# Patient Record
Sex: Male | Born: 1945 | Race: White | Hispanic: No | Marital: Married | Smoking: Former smoker
Health system: Southern US, Community
[De-identification: ages and names within clinical notes are randomized; demographics above are authoritative.]

## PROBLEM LIST (undated history)

## (undated) DIAGNOSIS — C3490 Malignant neoplasm of unspecified part of unspecified bronchus or lung: Secondary | ICD-10-CM

## (undated) DIAGNOSIS — I10 Essential (primary) hypertension: Secondary | ICD-10-CM

## (undated) DIAGNOSIS — Z5189 Encounter for other specified aftercare: Secondary | ICD-10-CM

## (undated) DIAGNOSIS — H269 Unspecified cataract: Secondary | ICD-10-CM

## (undated) DIAGNOSIS — E119 Type 2 diabetes mellitus without complications: Secondary | ICD-10-CM

## (undated) DIAGNOSIS — M1990 Unspecified osteoarthritis, unspecified site: Secondary | ICD-10-CM

## (undated) HISTORY — DX: Encounter for other specified aftercare: Z51.89

## (undated) HISTORY — PX: MELANOMA EXCISION: SHX5266

## (undated) HISTORY — DX: Unspecified osteoarthritis, unspecified site: M19.90

## (undated) HISTORY — PX: OTHER SURGICAL HISTORY: SHX169

## (undated) HISTORY — DX: Unspecified cataract: H26.9

## (undated) HISTORY — DX: Malignant neoplasm of unspecified part of unspecified bronchus or lung: C34.90

## (undated) HISTORY — PX: CHOLECYSTECTOMY: SHX55

## (undated) HISTORY — PX: CATARACT EXTRACTION: SUR2

## (undated) HISTORY — DX: Essential (primary) hypertension: I10

## (undated) HISTORY — DX: Type 2 diabetes mellitus without complications: E11.9

---

## 2008-02-20 ENCOUNTER — Ambulatory Visit (HOSPITAL_COMMUNITY): Admission: RE | Admit: 2008-02-20 | Discharge: 2008-02-21 | Payer: Self-pay | Admitting: Neurosurgery

## 2008-06-13 ENCOUNTER — Encounter: Admission: RE | Admit: 2008-06-13 | Discharge: 2008-06-13 | Payer: Self-pay | Admitting: Neurosurgery

## 2008-07-02 ENCOUNTER — Ambulatory Visit (HOSPITAL_COMMUNITY): Admission: RE | Admit: 2008-07-02 | Discharge: 2008-07-02 | Payer: Self-pay | Admitting: Neurosurgery

## 2009-12-13 ENCOUNTER — Encounter: Admission: RE | Admit: 2009-12-13 | Discharge: 2009-12-13 | Payer: Self-pay | Admitting: Neurosurgery

## 2010-02-25 ENCOUNTER — Inpatient Hospital Stay (HOSPITAL_COMMUNITY): Admission: RE | Admit: 2010-02-25 | Discharge: 2010-02-27 | Payer: Self-pay | Admitting: Neurosurgery

## 2010-06-04 ENCOUNTER — Encounter: Admission: RE | Admit: 2010-06-04 | Discharge: 2010-06-04 | Payer: Self-pay | Admitting: Neurosurgery

## 2010-11-02 LAB — GLUCOSE, CAPILLARY
Glucose-Capillary: 119 mg/dL — ABNORMAL HIGH (ref 70–99)
Glucose-Capillary: 150 mg/dL — ABNORMAL HIGH (ref 70–99)
Glucose-Capillary: 187 mg/dL — ABNORMAL HIGH (ref 70–99)
Glucose-Capillary: 189 mg/dL — ABNORMAL HIGH (ref 70–99)
Glucose-Capillary: 191 mg/dL — ABNORMAL HIGH (ref 70–99)
Glucose-Capillary: 239 mg/dL — ABNORMAL HIGH (ref 70–99)
Glucose-Capillary: 242 mg/dL — ABNORMAL HIGH (ref 70–99)
Glucose-Capillary: 274 mg/dL — ABNORMAL HIGH (ref 70–99)
Glucose-Capillary: 288 mg/dL — ABNORMAL HIGH (ref 70–99)
Glucose-Capillary: 338 mg/dL — ABNORMAL HIGH (ref 70–99)
Glucose-Capillary: 356 mg/dL — ABNORMAL HIGH (ref 70–99)

## 2010-11-02 LAB — BASIC METABOLIC PANEL
BUN: 22 mg/dL (ref 6–23)
CO2: 27 mEq/L (ref 19–32)
Calcium: 10.8 mg/dL — ABNORMAL HIGH (ref 8.4–10.5)
Chloride: 98 mEq/L (ref 96–112)
Creatinine, Ser: 1.19 mg/dL (ref 0.4–1.5)
GFR calc Af Amer: >60 mL/min (ref >60)
GFR calc non Af Amer: >60 mL/min (ref >60)
Glucose, Bld: 199 mg/dL — ABNORMAL HIGH (ref 70–99)
Potassium: 4.4 mEq/L (ref 3.5–5.1)
Sodium: 135 mEq/L (ref 135–145)

## 2010-11-02 LAB — CBC
HCT: 47.6 % (ref 39.0–52.0)
Hemoglobin: 16.4 g/dL (ref 13.0–17.0)
MCH: 30.4 pg (ref 26.0–34.0)
MCHC: 34.4 g/dL (ref 30.0–36.0)
MCV: 88.3 fL (ref 78.0–100.0)
Platelets: 231 10*3/uL (ref 150–400)
RBC: 5.39 MIL/uL (ref 4.22–5.81)
RDW: 12.8 % (ref 11.5–15.5)
WBC: 13.3 10*3/uL — ABNORMAL HIGH (ref 4.0–10.5)

## 2010-11-02 LAB — DIFFERENTIAL
Basophils Absolute: 0.1 10*3/uL (ref 0.0–0.1)
Basophils Relative: 0 % (ref 0–1)
Eosinophils Absolute: 0.3 10*3/uL (ref 0.0–0.7)
Eosinophils Relative: 2 % (ref 0–5)
Lymphocytes Relative: 28 % (ref 12–46)
Lymphs Abs: 3.7 10*3/uL (ref 0.7–4.0)
Monocytes Absolute: 1 10*3/uL (ref 0.1–1.0)
Monocytes Relative: 7 % (ref 3–12)
Neutro Abs: 8.3 10*3/uL — ABNORMAL HIGH (ref 1.7–7.7)
Neutrophils Relative %: 63 % (ref 43–77)

## 2010-11-02 LAB — TYPE AND SCREEN
ABO/RH(D): O NEG
Antibody Screen: NEGATIVE

## 2010-11-02 LAB — SURGICAL PCR SCREEN
MRSA, PCR: NEGATIVE
Staphylococcus aureus: POSITIVE — AB

## 2010-11-02 LAB — POCT I-STAT 4, (NA,K, GLUC, HGB,HCT)
Glucose, Bld: 248 mg/dL — ABNORMAL HIGH (ref 70–99)
HCT: 44 % (ref 39.0–52.0)
Hemoglobin: 15 g/dL (ref 13.0–17.0)
Potassium: 4.6 mEq/L (ref 3.5–5.1)
Sodium: 139 mEq/L (ref 135–145)

## 2010-12-02 ENCOUNTER — Other Ambulatory Visit: Payer: Self-pay | Admitting: Ophthalmology

## 2010-12-02 ENCOUNTER — Observation Stay (HOSPITAL_COMMUNITY)
Admission: RE | Admit: 2010-12-02 | Discharge: 2010-12-03 | Disposition: A | Discharge status: Home / Self Care | Payer: BC Managed Care – HMO | Source: Ambulatory Visit | Attending: Ophthalmology | Admitting: Ophthalmology

## 2010-12-02 DIAGNOSIS — I10 Essential (primary) hypertension: Secondary | ICD-10-CM | POA: Insufficient documentation

## 2010-12-02 DIAGNOSIS — T8529XA Other mechanical complication of intraocular lens, initial encounter: Principal | ICD-10-CM | POA: Insufficient documentation

## 2010-12-02 DIAGNOSIS — H59029 Cataract (lens) fragments in eye following cataract surgery, unspecified eye: Secondary | ICD-10-CM | POA: Insufficient documentation

## 2010-12-02 DIAGNOSIS — Y831 Surgical operation with implant of artificial internal device as the cause of abnormal reaction of the patient, or of later complication, without mention of misadventure at the time of the procedure: Secondary | ICD-10-CM | POA: Insufficient documentation

## 2010-12-02 DIAGNOSIS — E119 Type 2 diabetes mellitus without complications: Secondary | ICD-10-CM | POA: Insufficient documentation

## 2010-12-02 LAB — GLUCOSE, CAPILLARY
Glucose-Capillary: 219 mg/dL — ABNORMAL HIGH (ref 70–99)
Glucose-Capillary: 190 mg/dL — ABNORMAL HIGH (ref 70–99)
Glucose-Capillary: 256 mg/dL — ABNORMAL HIGH (ref 70–99)
Glucose-Capillary: 321 mg/dL — ABNORMAL HIGH (ref 70–99)

## 2010-12-03 LAB — GLUCOSE, CAPILLARY: Glucose-Capillary: 255 mg/dL — ABNORMAL HIGH (ref 70–99)

## 2010-12-12 NOTE — Op Note (Signed)
NAME:  Walter Berg, Walter Berg               ACCOUNT NO.:  [id number]  MEDICAL RECORD NO.:  [id number]           PATIENT TYPE:  O  LOCATION:  5151                         FACILITY:  [facility]  PHYSICIAN:  Iveliz Garay D. Ashley Royalty, M.D. DATE OF BIRTH:  [birth year]  DATE OF PROCEDURE: DATE OF DISCHARGE:                              OPERATIVE REPORT   ADMISSION DIAGNOSIS:  Dislocated intraocular lens and retained lens material in the left eye.  PROCEDURES:  Pars plana vitrectomy with removal of retained lens material, removal of intraocular lens from the vitreous, placement of secondary intraocular lens with suture in the left eye.  SURGEON:  Beulah Gandy. Ashley Royalty, MD  ASSISTANT:  Rosalie Doctor, MA  ANESTHESIA:  General.  DETAILS:  Usual prep and drape, the conjunctival peritomy from 8 o'clock around to 4 o'clock, half-thickness scleral flaps raised at 3 and 9 o'clock in anticipation of IOL suture.  A three-layered corneal wound between 10 and 2 o'clock was performed back on the white sclera and up into the corneal tissue.  A 25-gauge trocar was placed at 10, 2, and 4 o'clock.  Pars plana vitrectomy was begun just behind the pupillary axis where cortical remnants were seen ensnared in the capsular remnants as well.  Provisc was placed on the corneal surface and the biome viewing system was moved into place.  The vitrectomy was carried posteriorly where large white clumps of vitreous and lens material was removed. Scleral depression was used to gain access to the vitreous base and this area was trimmed.  Once all of the lens material was removed, the vitreous was teased from around the intraocular lens which was ensnared in vitreous and capsular remnants.  The intraocular lens was passed from the posterior chamber into the anterior chamber with forceps and out through the corneal wound.  It was sent for pathologic examination.  The indirect ophthalmoscope laser was moved into place, 936 burns were placed  around the retinal periphery in two rows, the power was 240 milliwatts, 1000 microns each, and 0.1 seconds each.  Two Prolene sutures were passed behind the iris and anterior to the capsular remnants from 3 o'clock to 9 o'clock.  These Prolene sutures were externalized through the corneoscleral wound.  A new intraocular lens made by [employer] model [id number], power 20.0 D, length 12.5 MM, optic 7.0 MM, serial numbers [id number], expiration date February 2015, was brought onto the field, inspected, and cleaned.  The eyelets of the lens were attached to the Prolene sutures.  The lens was passed into the posterior chamber and dialed into place.  Sutures were knotted beneath the scleral flaps and trimmed.  The corneal wound was closed with four interrupted 10-0 nylon sutures.  It was tested and found to be tight.  Additional vitrectomy was performed removing pigment granules from the vitreous cavity and additional vitreous.  A 30% gas-fluid exchange was performed.  The instruments and trocars were removed from the eye.  The conjunctiva was closed with 7-0 chromic suture.  Polymyxin and gentamicin were irrigated into [place] space.  Marcaine was injected around the globe for postop pain.  Decadron 10  mg was injected into the lower subconjunctival space.  Marcaine was injected around the globe for postop pain.  Closing pressure was 10 with a Risk manager. Polysporin ophthalmic ointment, patch and shield were placed.  The patient was awakened and taken to recovery in satisfactory condition. Complications none.  Duration 1 hour 30 minutes.     Beulah Gandy. Ashley Royalty, M.D.     JDM/MEDQ  D:  12/02/2010  T:  12/03/2010  Job:  [id number]  Electronically Signed by Alan Mulder M.D. on 12/12/2010 06:42:03 AM

## 2010-12-30 NOTE — Op Note (Signed)
NAME:  Walter Berg, Walter Berg               ACCOUNT NO.:  [id number]   MEDICAL RECORD NO.:  [id number]          PATIENT TYPE:  OIB   LOCATION:  3524                         FACILITY:  [facility]   PHYSICIAN:  Henry A. Pool, M.D.    DATE OF BIRTH:  1945/11/19   DATE OF PROCEDURE:  07/02/2008  DATE OF DISCHARGE:  07/02/2008                               OPERATIVE REPORT   PREOPERATIVE DIAGNOSIS:  Left L3-4 extraforaminal herniated nucleus  pulposus with left L3 radiculopathy.   POSTOPERATIVE DIAGNOSIS:  Left L3-4 extraforaminal herniated nucleus  pulposus with left L3 radiculopathy.   PROCEDURE NAME:  Left L3-4 extraforaminal microdiskectomy.   SURGEON:  Kathaleen Maser. Pool, MD   ASSISTANT:  Donalee Citrin, MD   ANESTHESIA:  General endotracheal.   INDICATIONS:  Walter Berg is a 65 year old male with history of previous  left-sided L3-4 laminotomy and microdiskectomy.  The patient presents  with persistent left lower extremity pain consistent with a left-sided  L3 radiculopathy.  Workup demonstrates evidence of an extraforaminal  disk herniation and compression of left-sided L3 nerve root as it exits  the spine.  The patient has been counseled as to his options.  He has  decided to proceed with extraforaminal microdiskectomy in hopes  improving his symptoms.   OPERATIVE NOTE:  The patient was brought to the operating room, placed  on operating table in supine position.  After adequate level of  anesthesia was achieved, the patient was placed prone on Wilson frame,  appropriately padded.  The patient's lumbar region was prepped and  draped sterilely.  A 10-blade was used to make a curvilinear skin  incision overlying the L3-4 interspace.  This was carried down sharply  in midline.  Subperiosteal dissection was then performed exposing the  lamina, facet joints, and the left-sided L3 transverse process.  Deep  self-retaining retractor was placed.  Intraoperative x-ray was taken and  level was confirmed.   Extraforaminal approach was then performed by  removing a small aspect of the lateral part of the superior facet of L4.  Intertransverse ligament was then elevated and resected in piecemeal  fashion using Kerrison rongeurs.  Underlying L3 nerve root was  identified.  Perineural venous plexus was coagulated and cut.  Disk  herniation was readily apparent.  This was then incised with a 15 blade  in rectangular fashion to widen the disk space.  Clean-out was achieved  using pituitary rongeurs, upbiting pituitary rongeurs, and Epstein  curettes.  All elements of the disk herniation were completely resected.  All loose or obviously degenerative disk material was removed from the  interspace.  At this point, a very thorough decompression had been  achieved.  There was no evidence of injury to thecal sac or nerve root.  The wound was then irrigated with antibiotic solution.  Gelfoam was  placed topically for hemostasis which found to be good.  Microscope and Kerrisons were removed.  Hemostasis on the left was  achieved with electrocautery.  The wound was then closed in layers with  Vicryl suture.  Steri-Strips and sterile dressing were applied.  There  were  no apparent complications.  The patient tolerated well and he  returns to recovery room postoperatively.           ______________________________  Kathaleen Maser Pool, M.D.     HAP/MEDQ  D:  07/02/2008  T:  07/03/2008  Job:  [id number]

## 2010-12-30 NOTE — Op Note (Signed)
NAME:  AZARIAN, STARACE               ACCOUNT NO.:  [id number]   MEDICAL RECORD NO.:  [id number]          PATIENT TYPE:  OIB   LOCATION:  3599                         FACILITY:  [facility]   PHYSICIAN:  Henry A. Pool, M.D.    DATE OF BIRTH:  1946/07/20   DATE OF PROCEDURE:  02/20/2008  DATE OF DISCHARGE:                               OPERATIVE REPORT   PREOPERATIVE DIAGNOSIS:  Left L3-L4 herniated nucleus pulposus with  radiculopathy.   POSTOPERATIVE DIAGNOSIS:  Left L3-L4 herniated nucleus pulposus with  radiculopathy.   PROCEDURE NOTE:  Left L3-L4 laminotomy and microdiskectomy.   SURGEON:  Kathaleen Maser. Pool, MD   ASSISTANT:  Reinaldo Meeker, MD   ANESTHESIA:  General endotracheal.   INDICATIONS:  Mr. Nesmith is a 65 year old male with history of severe  back and left lower extremity pain, paresthesias, weakness consistent  primarily with a left-sided L4 radiculopathy.  Workup demonstrates  evidence of a left-sided L3-L4 disk herniation with a superior fragment  causing compression of the thecal sac and L4 nerve root.  The patient  was counseled as to his options.  He decided to proceed with a left-  sided L3-L4 laminotomy and microdiskectomy in hopes of improving his  symptoms.   OPERATIVE NOTE:  The patient placed on the operative table in a supine  position.  After caudal anesthesia was achieved, the patient was  positioned prone on the Wilson frame and appropriately padded.  The  patient's lumbar region was prepped and draped sterilely.  A #10 blade  was used to make a curvilinear skin incision overlying the L3-L4  interspace.  This was carried down sharply in midline.  A subperiosteal  dissection was then performed exposing the lamina and facet joints of L3-  L4 on the left side.  Deep self-retaining retractors were placed.  Intraoperative x-rays were taken and level was confirmed.  The  laminotomy was then performed using high-speed drill and Kerrison  rongeurs to remove the  inferior aspect of the lamina of L3, medial  aspect of L3-L4 facet joint, and the superior rim of the L4 lamina.  The  ligament flavum was then elevated and resected in usual fashion using  Kerrison rongeurs.  The underlying thecal sac and exiting L4 nerve were  identified.  The microscope was then brought into the field.  Using  microdissection over the left side of L4 nerve root and underlying disk  herniation, epidural venous plexus was coagulated and cut.  Thecal sac  and L4 nerve root were gently mobilized and tracked toward the middle.  Disk herniation was then readily apparent.  This was incised with 15  blade in a rectangular fashion.  Wide space clean-out was achieved using  pituitary rongeurs, upward angled pituitary rongeurs, and Epstein  curettes.  A large amount of disk herniation was resected.  All loose  degenerative disk material was then removed from the interspace.  At  this point, a very thorough decompression was achieved.  There was no  injury to thecal sac or nerve roots.  The wound was then irrigated with  antibiotic solution.  Gelfoam was placed topically for hemostasis and  found to be good.  Microscope and retraction system were removed.  Hemostasis was mostly achieved with electrocautery.  The  wounds were closed in layers with Vicryl sutures.  Steri-Strips and  sterile dressing were applied.  There were no apparent complications.  The patient tolerated the procedure well and he returned to the recovery  room postoperatively.           ______________________________  Kathaleen Maser Pool, M.D.     HAP/MEDQ  D:  02/20/2008  T:  02/21/2008  Job:  [id number]

## 2011-05-14 LAB — BASIC METABOLIC PANEL
CO2: 24
Calcium: 10
GFR calc Af Amer: >60
GFR calc non Af Amer: >60
Sodium: 136

## 2011-05-14 LAB — CBC
Hemoglobin: 15.4
MCHC: 35.5
RBC: 4.89

## 2011-05-14 LAB — ABO/RH: ABO/RH(D): O NEG

## 2011-05-14 LAB — DIFFERENTIAL
Lymphocytes Relative: 27
Lymphs Abs: 3.6
Monocytes Absolute: 0.9
Monocytes Relative: 7
Neutro Abs: 8.3 — ABNORMAL HIGH

## 2011-05-14 LAB — TYPE AND SCREEN

## 2011-05-19 LAB — GLUCOSE, CAPILLARY: Glucose-Capillary: 266 — ABNORMAL HIGH

## 2011-05-19 LAB — COMPREHENSIVE METABOLIC PANEL
ALT: 37
AST: 25
Alkaline Phosphatase: 66
CO2: 27
GFR calc non Af Amer: >60
Glucose, Bld: 262 — ABNORMAL HIGH
Potassium: 4.7
Sodium: 138
Total Protein: 6.5

## 2011-05-19 LAB — CBC
Hemoglobin: 15.4
RBC: 5.01
WBC: 11 — ABNORMAL HIGH

## 2011-07-01 ENCOUNTER — Ambulatory Visit (INDEPENDENT_AMBULATORY_CARE_PROVIDER_SITE_OTHER): Payer: BC Managed Care – HMO | Admitting: Ophthalmology

## 2011-07-01 DIAGNOSIS — E1139 Type 2 diabetes mellitus with other diabetic ophthalmic complication: Secondary | ICD-10-CM

## 2011-07-01 DIAGNOSIS — H3581 Retinal edema: Secondary | ICD-10-CM

## 2011-07-01 DIAGNOSIS — E11319 Type 2 diabetes mellitus with unspecified diabetic retinopathy without macular edema: Secondary | ICD-10-CM

## 2011-07-01 DIAGNOSIS — H43819 Vitreous degeneration, unspecified eye: Secondary | ICD-10-CM

## 2011-11-04 ENCOUNTER — Ambulatory Visit (INDEPENDENT_AMBULATORY_CARE_PROVIDER_SITE_OTHER): Payer: BC Managed Care – HMO | Admitting: Ophthalmology

## 2011-11-11 ENCOUNTER — Ambulatory Visit (INDEPENDENT_AMBULATORY_CARE_PROVIDER_SITE_OTHER): Payer: BC Managed Care – HMO | Admitting: Ophthalmology

## 2011-12-02 ENCOUNTER — Ambulatory Visit (INDEPENDENT_AMBULATORY_CARE_PROVIDER_SITE_OTHER): Payer: BC Managed Care – HMO | Admitting: Ophthalmology

## 2011-12-02 DIAGNOSIS — E11319 Type 2 diabetes mellitus with unspecified diabetic retinopathy without macular edema: Secondary | ICD-10-CM

## 2011-12-02 DIAGNOSIS — E11359 Type 2 diabetes mellitus with proliferative diabetic retinopathy without macular edema: Secondary | ICD-10-CM

## 2011-12-02 DIAGNOSIS — H43819 Vitreous degeneration, unspecified eye: Secondary | ICD-10-CM

## 2011-12-02 DIAGNOSIS — E1139 Type 2 diabetes mellitus with other diabetic ophthalmic complication: Secondary | ICD-10-CM

## 2012-06-08 ENCOUNTER — Ambulatory Visit (INDEPENDENT_AMBULATORY_CARE_PROVIDER_SITE_OTHER): Payer: BC Managed Care – HMO | Admitting: Ophthalmology

## 2012-06-08 DIAGNOSIS — H35039 Hypertensive retinopathy, unspecified eye: Secondary | ICD-10-CM

## 2012-06-08 DIAGNOSIS — E11319 Type 2 diabetes mellitus with unspecified diabetic retinopathy without macular edema: Secondary | ICD-10-CM

## 2012-06-08 DIAGNOSIS — E1165 Type 2 diabetes mellitus with hyperglycemia: Secondary | ICD-10-CM

## 2012-06-08 DIAGNOSIS — E1139 Type 2 diabetes mellitus with other diabetic ophthalmic complication: Secondary | ICD-10-CM

## 2012-06-08 DIAGNOSIS — I10 Essential (primary) hypertension: Secondary | ICD-10-CM

## 2012-06-08 DIAGNOSIS — H43819 Vitreous degeneration, unspecified eye: Secondary | ICD-10-CM

## 2012-10-12 ENCOUNTER — Ambulatory Visit (INDEPENDENT_AMBULATORY_CARE_PROVIDER_SITE_OTHER): Payer: BC Managed Care – HMO | Admitting: Ophthalmology

## 2012-11-09 ENCOUNTER — Ambulatory Visit (INDEPENDENT_AMBULATORY_CARE_PROVIDER_SITE_OTHER): Payer: BC Managed Care – HMO | Admitting: Ophthalmology

## 2012-11-23 ENCOUNTER — Ambulatory Visit (INDEPENDENT_AMBULATORY_CARE_PROVIDER_SITE_OTHER): Payer: BC Managed Care – PPO | Admitting: Ophthalmology

## 2012-11-23 DIAGNOSIS — H3581 Retinal edema: Secondary | ICD-10-CM

## 2012-11-23 DIAGNOSIS — E1165 Type 2 diabetes mellitus with hyperglycemia: Secondary | ICD-10-CM

## 2012-11-23 DIAGNOSIS — I10 Essential (primary) hypertension: Secondary | ICD-10-CM

## 2012-11-23 DIAGNOSIS — E1139 Type 2 diabetes mellitus with other diabetic ophthalmic complication: Secondary | ICD-10-CM

## 2012-11-23 DIAGNOSIS — E11319 Type 2 diabetes mellitus with unspecified diabetic retinopathy without macular edema: Secondary | ICD-10-CM

## 2012-11-23 DIAGNOSIS — H35039 Hypertensive retinopathy, unspecified eye: Secondary | ICD-10-CM

## 2012-11-23 DIAGNOSIS — H43819 Vitreous degeneration, unspecified eye: Secondary | ICD-10-CM

## 2013-03-29 ENCOUNTER — Ambulatory Visit (INDEPENDENT_AMBULATORY_CARE_PROVIDER_SITE_OTHER): Payer: BC Managed Care – PPO | Admitting: Ophthalmology

## 2013-03-31 ENCOUNTER — Ambulatory Visit (INDEPENDENT_AMBULATORY_CARE_PROVIDER_SITE_OTHER): Payer: BC Managed Care – PPO | Admitting: Ophthalmology

## 2015-10-02 ENCOUNTER — Other Ambulatory Visit: Payer: Self-pay | Admitting: Neurosurgery

## 2015-10-02 DIAGNOSIS — M5416 Radiculopathy, lumbar region: Secondary | ICD-10-CM

## 2015-10-23 ENCOUNTER — Other Ambulatory Visit: Payer: Self-pay

## 2015-11-01 ENCOUNTER — Other Ambulatory Visit (HOSPITAL_COMMUNITY): Payer: Self-pay | Admitting: Interventional Radiology

## 2015-11-04 ENCOUNTER — Ambulatory Visit
Admission: RE | Admit: 2015-11-04 | Discharge: 2015-11-04 | Disposition: A | Discharge status: Home / Self Care | Payer: Medicare Other | Source: Ambulatory Visit | Attending: Neurosurgery | Admitting: Neurosurgery

## 2015-11-04 DIAGNOSIS — M5416 Radiculopathy, lumbar region: Secondary | ICD-10-CM

## 2015-11-04 MED ORDER — GADOBENATE DIMEGLUMINE 529 MG/ML IV SOLN
20.0000 mL | Freq: Once | INTRAVENOUS | Status: AC | PRN
  Administered 2015-11-04: 20 mL via INTRAVENOUS

## 2015-12-05 ENCOUNTER — Other Ambulatory Visit: Payer: Self-pay | Admitting: Neurosurgery

## 2015-12-05 DIAGNOSIS — M5416 Radiculopathy, lumbar region: Secondary | ICD-10-CM

## 2015-12-18 ENCOUNTER — Ambulatory Visit
Admission: RE | Admit: 2015-12-18 | Discharge: 2015-12-18 | Disposition: A | Discharge status: Home / Self Care | Payer: Medicare Other | Source: Ambulatory Visit | Attending: Neurosurgery | Admitting: Neurosurgery

## 2015-12-18 ENCOUNTER — Other Ambulatory Visit: Payer: Self-pay | Admitting: Neurosurgery

## 2015-12-18 DIAGNOSIS — M5416 Radiculopathy, lumbar region: Secondary | ICD-10-CM

## 2015-12-18 MED ORDER — METHYLPREDNISOLONE ACETATE 40 MG/ML INJ SUSP (RADIOLOG: 120.0000 mg | Freq: Once | INTRAMUSCULAR | Status: DC

## 2015-12-18 MED ORDER — IOPAMIDOL (ISOVUE-M 200) INJECTION 41%: 1.0000 mL | Freq: Once | INTRAMUSCULAR | Status: DC

## 2015-12-18 NOTE — Discharge Instructions (Signed)

## 2016-01-01 ENCOUNTER — Other Ambulatory Visit: Payer: BLUE CROSS/BLUE SHIELD

## 2016-01-01 ENCOUNTER — Other Ambulatory Visit (HOSPITAL_COMMUNITY): Payer: Self-pay | Admitting: Internal Medicine

## 2016-01-01 DIAGNOSIS — C801 Malignant (primary) neoplasm, unspecified: Secondary | ICD-10-CM

## 2016-01-08 ENCOUNTER — Ambulatory Visit (HOSPITAL_COMMUNITY)
Admission: RE | Admit: 2016-01-08 | Discharge: 2016-01-08 | Disposition: A | Discharge status: Home / Self Care | Payer: BLUE CROSS/BLUE SHIELD | Source: Ambulatory Visit | Attending: Internal Medicine | Admitting: Internal Medicine

## 2016-01-08 DIAGNOSIS — C801 Malignant (primary) neoplasm, unspecified: Secondary | ICD-10-CM | POA: Diagnosis present

## 2016-01-08 DIAGNOSIS — C787 Secondary malignant neoplasm of liver and intrahepatic bile duct: Secondary | ICD-10-CM | POA: Diagnosis not present

## 2016-01-08 DIAGNOSIS — C7802 Secondary malignant neoplasm of left lung: Secondary | ICD-10-CM | POA: Diagnosis present

## 2016-01-08 DIAGNOSIS — C7971 Secondary malignant neoplasm of right adrenal gland: Secondary | ICD-10-CM | POA: Diagnosis not present

## 2016-01-08 DIAGNOSIS — C771 Secondary and unspecified malignant neoplasm of intrathoracic lymph nodes: Secondary | ICD-10-CM | POA: Insufficient documentation

## 2016-01-08 DIAGNOSIS — C7951 Secondary malignant neoplasm of bone: Secondary | ICD-10-CM | POA: Insufficient documentation

## 2016-01-08 DIAGNOSIS — C772 Secondary and unspecified malignant neoplasm of intra-abdominal lymph nodes: Secondary | ICD-10-CM | POA: Insufficient documentation

## 2016-01-08 LAB — GLUCOSE, CAPILLARY: GLUCOSE-CAPILLARY: 147 mg/dL — AB (ref 65–99)

## 2016-01-08 MED ORDER — FLUDEOXYGLUCOSE F - 18 (FDG) INJECTION: 12.5000 | Freq: Once | INTRAVENOUS | Status: DC | PRN

## 2016-01-15 ENCOUNTER — Encounter (HOSPITAL_COMMUNITY): Payer: Self-pay | Admitting: Family Medicine

## 2016-01-15 ENCOUNTER — Other Ambulatory Visit (HOSPITAL_COMMUNITY): Payer: Self-pay | Admitting: Hematology & Oncology

## 2016-01-15 ENCOUNTER — Inpatient Hospital Stay (HOSPITAL_COMMUNITY)
Admission: AD | Admit: 2016-01-15 | Discharge: 2016-01-18 | DRG: 948 | Disposition: A | Discharge status: Home Health | Payer: BLUE CROSS/BLUE SHIELD | Source: Ambulatory Visit | Attending: Internal Medicine | Admitting: Internal Medicine

## 2016-01-15 ENCOUNTER — Encounter (HOSPITAL_COMMUNITY): Payer: BLUE CROSS/BLUE SHIELD | Attending: Hematology & Oncology | Admitting: Hematology & Oncology

## 2016-01-15 ENCOUNTER — Encounter (HOSPITAL_COMMUNITY): Payer: Self-pay | Admitting: Hematology & Oncology

## 2016-01-15 VITALS — BP 114/59 | HR 75 | Temp 99.3°F | Resp 20 | Ht 71.0 in | Wt 268.8 lb

## 2016-01-15 DIAGNOSIS — G893 Neoplasm related pain (acute) (chronic): Secondary | ICD-10-CM

## 2016-01-15 DIAGNOSIS — Z85820 Personal history of malignant melanoma of skin: Secondary | ICD-10-CM | POA: Diagnosis not present

## 2016-01-15 DIAGNOSIS — C7971 Secondary malignant neoplasm of right adrenal gland: Secondary | ICD-10-CM | POA: Diagnosis present

## 2016-01-15 DIAGNOSIS — C778 Secondary and unspecified malignant neoplasm of lymph nodes of multiple regions: Secondary | ICD-10-CM

## 2016-01-15 DIAGNOSIS — C799 Secondary malignant neoplasm of unspecified site: Secondary | ICD-10-CM | POA: Diagnosis not present

## 2016-01-15 DIAGNOSIS — C772 Secondary and unspecified malignant neoplasm of intra-abdominal lymph nodes: Secondary | ICD-10-CM | POA: Diagnosis present

## 2016-01-15 DIAGNOSIS — C771 Secondary and unspecified malignant neoplasm of intrathoracic lymph nodes: Secondary | ICD-10-CM | POA: Diagnosis present

## 2016-01-15 DIAGNOSIS — J90 Pleural effusion, not elsewhere classified: Secondary | ICD-10-CM

## 2016-01-15 DIAGNOSIS — I10 Essential (primary) hypertension: Secondary | ICD-10-CM | POA: Diagnosis present

## 2016-01-15 DIAGNOSIS — Z87891 Personal history of nicotine dependence: Secondary | ICD-10-CM

## 2016-01-15 DIAGNOSIS — C761 Malignant neoplasm of thorax: Secondary | ICD-10-CM

## 2016-01-15 DIAGNOSIS — C801 Malignant (primary) neoplasm, unspecified: Secondary | ICD-10-CM

## 2016-01-15 DIAGNOSIS — C7951 Secondary malignant neoplasm of bone: Secondary | ICD-10-CM

## 2016-01-15 DIAGNOSIS — IMO0001 Reserved for inherently not codable concepts without codable children: Secondary | ICD-10-CM

## 2016-01-15 DIAGNOSIS — C787 Secondary malignant neoplasm of liver and intrahepatic bile duct: Secondary | ICD-10-CM | POA: Diagnosis present

## 2016-01-15 DIAGNOSIS — M25552 Pain in left hip: Secondary | ICD-10-CM | POA: Diagnosis present

## 2016-01-15 DIAGNOSIS — Z794 Long term (current) use of insulin: Secondary | ICD-10-CM | POA: Diagnosis not present

## 2016-01-15 DIAGNOSIS — E119 Type 2 diabetes mellitus without complications: Secondary | ICD-10-CM | POA: Diagnosis present

## 2016-01-15 DIAGNOSIS — R52 Pain, unspecified: Secondary | ICD-10-CM | POA: Diagnosis not present

## 2016-01-15 DIAGNOSIS — Z8042 Family history of malignant neoplasm of prostate: Secondary | ICD-10-CM | POA: Diagnosis not present

## 2016-01-15 DIAGNOSIS — C7989 Secondary malignant neoplasm of other specified sites: Secondary | ICD-10-CM | POA: Diagnosis present

## 2016-01-15 LAB — CBC WITH DIFFERENTIAL/PLATELET
BASOS ABS: 0 10*3/uL (ref 0.0–0.1)
BASOS PCT: 0 %
EOS ABS: 0.4 10*3/uL (ref 0.0–0.7)
Eosinophils Relative: 3 %
HCT: 41.4 % (ref 39.0–52.0)
HEMOGLOBIN: 13.2 g/dL (ref 13.0–17.0)
Lymphocytes Relative: 26 %
Lymphs Abs: 3.6 10*3/uL (ref 0.7–4.0)
MCH: 28.6 pg (ref 26.0–34.0)
MCHC: 31.9 g/dL (ref 30.0–36.0)
MCV: 89.6 fL (ref 78.0–100.0)
MONOS PCT: 8 %
Monocytes Absolute: 1.2 10*3/uL — ABNORMAL HIGH (ref 0.1–1.0)
NEUTROS PCT: 63 %
Neutro Abs: 8.7 10*3/uL — ABNORMAL HIGH (ref 1.7–7.7)
Platelets: 299 10*3/uL (ref 150–400)
RBC: 4.62 MIL/uL (ref 4.22–5.81)
RDW: 13.6 % (ref 11.5–15.5)
WBC: 13.9 10*3/uL — ABNORMAL HIGH (ref 4.0–10.5)

## 2016-01-15 LAB — COMPREHENSIVE METABOLIC PANEL
ALBUMIN: 3.3 g/dL — AB (ref 3.5–5.0)
ALK PHOS: 63 U/L (ref 38–126)
ALT: 15 U/L — ABNORMAL LOW (ref 17–63)
ANION GAP: 7 (ref 5–15)
AST: 16 U/L (ref 15–41)
BILIRUBIN TOTAL: 0.2 mg/dL — AB (ref 0.3–1.2)
BUN: 28 mg/dL — AB (ref 6–20)
CALCIUM: 8.3 mg/dL — AB (ref 8.9–10.3)
CO2: 25 mmol/L (ref 22–32)
CREATININE: 1.15 mg/dL (ref 0.61–1.24)
Chloride: 103 mmol/L (ref 101–111)
GFR calc Af Amer: >60 mL/min (ref >60)
GFR calc non Af Amer: >60 mL/min (ref >60)
GLUCOSE: 75 mg/dL (ref 65–99)
Potassium: 3.8 mmol/L (ref 3.5–5.1)
Sodium: 135 mmol/L (ref 135–145)
TOTAL PROTEIN: 7 g/dL (ref 6.5–8.1)

## 2016-01-15 LAB — PROTIME-INR
INR: 0.91 (ref 0.00–1.49)
PROTHROMBIN TIME: 12.5 s (ref 11.6–15.2)

## 2016-01-15 LAB — GLUCOSE, CAPILLARY: GLUCOSE-CAPILLARY: 151 mg/dL — AB (ref 65–99)

## 2016-01-15 MED ORDER — TAMSULOSIN HCL 0.4 MG PO CAPS
0.4000 mg | ORAL_CAPSULE | Freq: Every day | ORAL | Status: DC
  Administered 2016-01-16 – 2016-01-18 (×3): 0.4 mg via ORAL
  Filled 2016-01-15 (×3): qty 1

## 2016-01-15 MED ORDER — METOPROLOL TARTRATE 25 MG PO TABS
25.0000 mg | ORAL_TABLET | Freq: Two times a day (BID) | ORAL | Status: DC
  Administered 2016-01-15 – 2016-01-18 (×6): 25 mg via ORAL
  Filled 2016-01-15 (×6): qty 1

## 2016-01-15 MED ORDER — INSULIN ASPART 100 UNIT/ML ~~LOC~~ SOLN
4.0000 [IU] | Freq: Three times a day (TID) | SUBCUTANEOUS | Status: DC
Start: 2016-01-16 — End: 2016-01-18
  Administered 2016-01-16 – 2016-01-18 (×6): 4 [IU] via SUBCUTANEOUS

## 2016-01-15 MED ORDER — DIPHENHYDRAMINE HCL 12.5 MG/5ML PO ELIX
12.5000 mg | ORAL_SOLUTION | Freq: Four times a day (QID) | ORAL | Status: DC | PRN
  Administered 2016-01-16: 12.5 mg via ORAL
  Filled 2016-01-15: qty 5

## 2016-01-15 MED ORDER — SODIUM CHLORIDE 0.9 % IV SOLN
INTRAVENOUS | Status: DC
  Administered 2016-01-15: 18:00:00 via INTRAVENOUS
  Administered 2016-01-16: 1000 mL via INTRAVENOUS
  Administered 2016-01-18: via INTRAVENOUS

## 2016-01-15 MED ORDER — POLYETHYLENE GLYCOL 3350 17 G PO PACK
17.0000 g | PACK | Freq: Every day | ORAL | Status: DC
  Administered 2016-01-16: 17 g via ORAL
  Filled 2016-01-15 (×2): qty 1

## 2016-01-15 MED ORDER — MORPHINE SULFATE 2 MG/ML IV SOLN
INTRAVENOUS | Status: DC
  Administered 2016-01-15: 18:00:00 via INTRAVENOUS
  Administered 2016-01-16: 6 mg via INTRAVENOUS
  Administered 2016-01-16: 14 mg via INTRAVENOUS
  Administered 2016-01-16: 6 mg via INTRAVENOUS
  Filled 2016-01-15: qty 25

## 2016-01-15 MED ORDER — SODIUM CHLORIDE 0.9% FLUSH: 9.0000 mL | INTRAVENOUS | Status: DC | PRN

## 2016-01-15 MED ORDER — ONDANSETRON HCL 4 MG/2ML IJ SOLN: 4.0000 mg | Freq: Four times a day (QID) | INTRAMUSCULAR | Status: DC | PRN

## 2016-01-15 MED ORDER — INSULIN ASPART 100 UNIT/ML ~~LOC~~ SOLN
0.0000 [IU] | Freq: Three times a day (TID) | SUBCUTANEOUS | Status: DC
Start: 2016-01-16 — End: 2016-01-18
  Administered 2016-01-16: 3 [IU] via SUBCUTANEOUS
  Administered 2016-01-16: 2 [IU] via SUBCUTANEOUS
  Administered 2016-01-16: 8 [IU] via SUBCUTANEOUS
  Administered 2016-01-17: 5 [IU] via SUBCUTANEOUS
  Administered 2016-01-18: 3 [IU] via SUBCUTANEOUS

## 2016-01-15 MED ORDER — DIPHENHYDRAMINE HCL 50 MG/ML IJ SOLN
12.5000 mg | Freq: Four times a day (QID) | INTRAMUSCULAR | Status: DC | PRN
  Administered 2016-01-15 – 2016-01-17 (×2): 12.5 mg via INTRAVENOUS
  Filled 2016-01-15 (×2): qty 1

## 2016-01-15 MED ORDER — ENOXAPARIN SODIUM 60 MG/0.6ML ~~LOC~~ SOLN
60.0000 mg | SUBCUTANEOUS | Status: DC
Start: 2016-01-15 — End: 2016-01-16
  Administered 2016-01-15: 60 mg via SUBCUTANEOUS
  Filled 2016-01-15: qty 0.6

## 2016-01-15 MED ORDER — HYDROCODONE-ACETAMINOPHEN 10-325 MG PO TABS
2.0000 | ORAL_TABLET | ORAL | Status: AC
  Administered 2016-01-15: 2 via ORAL
  Filled 2016-01-15: qty 2

## 2016-01-15 MED ORDER — INSULIN DETEMIR 100 UNIT/ML ~~LOC~~ SOLN
60.0000 [IU] | Freq: Two times a day (BID) | SUBCUTANEOUS | Status: DC
  Administered 2016-01-16 – 2016-01-18 (×4): 60 [IU] via SUBCUTANEOUS
  Filled 2016-01-15 (×10): qty 0.6

## 2016-01-15 MED ORDER — MORPHINE SULFATE (PF) 2 MG/ML IV SOLN
1.0000 mg | INTRAVENOUS | Status: DC | PRN
Start: 2016-01-15 — End: 2016-01-18
  Administered 2016-01-15 – 2016-01-16 (×3): 1 mg via INTRAVENOUS
  Administered 2016-01-16: 2 mg via INTRAVENOUS
  Administered 2016-01-17: 1 mg via INTRAVENOUS
  Filled 2016-01-15 (×5): qty 1

## 2016-01-15 MED ORDER — FLUTICASONE PROPIONATE 50 MCG/ACT NA SUSP
2.0000 | Freq: Every day | NASAL | Status: DC
  Administered 2016-01-15 – 2016-01-18 (×4): 2 via NASAL
  Filled 2016-01-15: qty 16

## 2016-01-15 MED ORDER — NALOXONE HCL 0.4 MG/ML IJ SOLN: 0.4000 mg | INTRAMUSCULAR | Status: DC | PRN

## 2016-01-15 NOTE — H&P (Signed)
History and Physical    Walter Berg DOB: 10-30-1945 DOA: 01/15/2016  PCP: Emelda Fear, DO   Patient coming from: Home, via oncology clinic  Chief Complaint: Severe left hip pain  HPI: Walter Berg is a 69 y.o. male with medical history significant for hypertension, insulin-dependent diabetes mellitus, and metastatic adenocarcinoma with unknown primary who presents from his oncology clinic as a direct admission for intractable pain. Patient reports recurrent health problems over the past year which included respiratory and urinary tract infections, and severe lumbar and left hip pain. 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 He underwent PET scan on 01/08/2016 which demonstrates metastatic disease involving intrathoracic and upper abdominal lymph nodes, left hemithorax, liver, right adrenal gland, and bones. Based on imaging study, bronchogenic carcinoma is suspected as the primary. Patient was seen in the oncology clinic earlier today to establish care. He was in excruciating pain, barely able to make it onto the exam table due to this. Oncologist contacted the hospitalist service at [facility] for direct admission in order to manage the patient's intractable pain and obtain a liver biopsy under ultrasound guidance.  Walter Berg has been directly admitted to the medical-surgical unit at [facility] where he has interviewed and examined. He is in no apparent respiratory distress and with stable vital signs. He is in severe pain at this time, and will be admitted for ongoing evaluation and management of intractable pain at the lumbar spine and left hip.   Review of Systems:  All other systems reviewed and apart from HPI, are negative.  Past Medical History  Diagnosis Date  . Lung cancer ([place])   . Diabetes mellitus without complication ([place])   . Hypertension   . Arthritis   . Blood  transfusion without reported diagnosis   . Cataract     Past Surgical History  Procedure Laterality Date  . Cholecystectomy      2001  . Back surgeries  L4/L5 3 surgeries with last surgery being a fusion  . Cataract extraction Bilateral   . Melanoma excision Right     right side of face in 2014     reports that he quit smoking about 15 years ago. His smoking use included Cigarettes. He has a 49.5 pack-year smoking history. He does not have any smokeless tobacco history on file. He reports that he does not drink alcohol or use illicit drugs.  Allergies  Allergen Reactions  . Pregabalin Other (See Comments)    Makes pt unable to eat/drink etc  . Sulfa Antibiotics Other (See Comments)    Was 70yrold at the time of the sulfa allergy/doesn't know what it does    Family History  Problem Relation Age of Onset  . Prostate cancer Brother   . Prostate cancer Brother      Prior to Admission medications   Medication Sig Start Date End Date Taking? Authorizing Provider  amLODipine (NORVASC) 10 MG tablet Take 10 mg by mouth daily.    Historical Provider, MD  Aspirin Effervescent (ALKA-SELTZER PO) Take by mouth. 1 pack as needed    Historical Provider, MD  cephALEXin (KEFLEX) 500 MG capsule Take 500 mg by mouth 2 (two) times daily.    Historical Provider, MD  insulin detemir (LEVEMIR) 100 UNIT/ML injection Inject 75 Units into the skin 2 (two) times daily.    Historical Provider, MD  insulin lispro (HUMALOG) 100 UNIT/ML injection Inject 30 Units into  the skin 3 (three) times daily before meals.    Historical Provider, MD  Liraglutide (VICTOZA) 18 MG/3ML SOPN Inject 1.8 mg into the skin daily.    Historical Provider, MD  losartan-hydrochlorothiazide (HYZAAR) 100-12.5 MG tablet Take 1 tablet by mouth daily. 01/02/16   Historical Provider, MD  metFORMIN (GLUCOPHAGE) 1000 MG tablet Take 1,000 mg by mouth 2 (two) times daily.    Historical Provider, MD  metoprolol tartrate (LOPRESSOR) 25 MG  tablet Take 25 mg by mouth 2 (two) times daily.    Historical Provider, MD  tamsulosin (FLOMAX) 0.4 MG CAPS capsule Take 0.4 mg by mouth daily.    Historical Provider, MD    Physical Exam: Filed Vitals:   01/15/16 1619  BP: 130/56  Pulse: 77  Temp: 97.6 F (36.4 C)  Resp: 18  Height: '5\' 11"'$  (1.803 m)  Weight: 121.9 kg (268 lb 11.9 oz)  SpO2: 97%      Constitutional: NAD, calm, in obvious discomfort Eyes: PERTLA, lids and conjunctivae normal ENMT: Mucous membranes are moist. Posterior pharynx clear of any exudate or lesions.   Neck: normal, supple, no masses, no thyromegaly Respiratory: clear to auscultation bilaterally, no wheezing, no crackles. Normal respiratory effort.   Cardiovascular: S1 & S2 heard, regular rate and rhythm, no significant murmurs / rubs / gallops. No extremity edema. 2+ pedal pulses.   Abdomen: No distension, no tenderness, no masses palpated. Bowel sounds normal.  Musculoskeletal: no clubbing / cyanosis. No joint deformity upper and lower extremities. Normal muscle tone.  Skin: no significant rashes, lesions, ulcers. Warm, dry, well-perfused. Neurologic: CN 2-12 grossly intact. Sensation intact, DTR normal. Strength and ROM testing severely limited by pt's pain.  Psychiatric: Normal judgment and insight. Alert and oriented x 3. Normal mood and affect.     Labs on Admission: I have personally reviewed following labs and imaging studies  CBC: No results for input(s): WBC, NEUTROABS, HGB, HCT, MCV, PLT in the last 168 hours. Basic Metabolic Panel: No results for input(s): NA, K, CL, CO2, GLUCOSE, BUN, CREATININE, CALCIUM, MG, PHOS in the last 168 hours. GFR: CrCl cannot be calculated (Patient has no serum creatinine result on file.). Liver Function Tests: No results for input(s): AST, ALT, ALKPHOS, BILITOT, PROT, ALBUMIN in the last 168 hours. No results for input(s): LIPASE, AMYLASE in the last 168 hours. No results for input(s): AMMONIA in the last  168 hours. Coagulation Profile: No results for input(s): INR, PROTIME in the last 168 hours. Cardiac Enzymes: No results for input(s): CKTOTAL, CKMB, CKMBINDEX, TROPONINI in the last 168 hours. BNP (last 3 results) No results for input(s): PROBNP in the last 8760 hours. HbA1C: No results for input(s): HGBA1C in the last 72 hours. CBG: No results for input(s): GLUCAP in the last 168 hours. Lipid Profile: No results for input(s): CHOL, HDL, LDLCALC, TRIG, CHOLHDL, LDLDIRECT in the last 72 hours. Thyroid Function Tests: No results for input(s): TSH, T4TOTAL, FREET4, T3FREE, THYROIDAB in the last 72 hours. Anemia Panel: No results for input(s): VITAMINB12, FOLATE, FERRITIN, TIBC, IRON, RETICCTPCT in the last 72 hours. Urine analysis: No results found for: COLORURINE, APPEARANCEUR, LABSPEC, PHURINE, GLUCOSEU, HGBUR, BILIRUBINUR, KETONESUR, PROTEINUR, UROBILINOGEN, NITRITE, LEUKOCYTESUR Sepsis Labs: '@LABRCNTIP'$ (procalcitonin:4,lacticidven:4) )No results found for this or any previous visit (from the past 240 hour(s)).   Radiological Exams on Admission: No results found.  EKG: Not performed, will obtain as appropriate  Assessment/Plan  1. Intractable pain  - Direct admission from oncology clinic for PCA  - Primary sites of pain  include lumbar spine and left hip - No acute low-back or hip findings on recent lumbar MRI and PET  - PCA ordered by oncology   2. Metastatic adenocarcinoma, unknown primary  - PET (01/08/16) demonstrates metastatic disease involving intrathoracic and upper abd LN's, left chest, liver, right adrenal, and bone - Seen by oncology on day of admission to establish care  - Primary remains unknown, likely bronchogenic based on PET findings; US-guided liver biopsy requested by oncology    3. Hypertension  - Running on the low-side on presentation   - Managed with Norvasc, losartan-HCTZ, and Lopressor at home  - Continue Lopressor with holding parameters; hold other  home agents for now, resume prn    4. Type II DM  - No A1c on file  - Managed with Levemir 75 units BID, Humalog 10 units TID, metformin, and Victoza at home  - Hold metformin and Victoza while in hospital  - Continue Levemir and Humalog at reduced-dose to start, plus sliding-scale correctional  - Check CBG with meals and qHS, adjust insulin regimen prn  - Carb-modified diet when appropriate    DVT prophylaxis: sq Lovenox  Code Status: Full  Family Communication: Discussed with patient  Disposition Plan: Admit to med/surg  Consults called: None  Admission status: Inpatient   Vianne Bulls, MD [facility] Pager [phone]  If 7PM-7AM, please contact night-coverage [email or web address] Password [facility]  01/15/2016, 5:35 PM

## 2016-01-15 NOTE — Progress Notes (Signed)
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 male is here on referral from Dr. Jacquiline Doe for newly diagnosed adenocarcinoma of unknown primary. PET/CT performed on 01/08/2016 showed metastatic disease involving intrathoracic and upper abdominal LN, L hemi thorax, liver, R adrenal gland and bones.   Walter Berg is accompanied by his son, daughter, and daughter's significant other. Presents in wheelchair. I personally reviewed and went over PET scan and pleural fluid cytology results with the patient.  Patient notes that he has been sick for a while, between 6 months and a year. He has been to several doctors, family doctors, ER doctors. It was mostly respiratory and hard for him to gather breath. He also had several bladder infections. He has chronic back problems. He went back to his original surgeon in [place] who recommended he get an MRI. This took quite sometime because he needed to use a "bigger machine." He notes the MRI "didn't show anything. After this he was to receive a series of shots in his back. He then thought he had the flu and "couldn't hardly breath". Hardly anything he did made him gasp for breath. He was unable to receive the back shots while he had this respiratory issue. He went to an urgent care that recommended he to go to an ER. He went to the ER in [place] [facility]). This is where he discovered "the big bomb". "I'm still in the dark".   He had a large L pleural effusion, per records it was initially drained and cytology was positive for adenocarcinoma. On discussion with Dr. Jacquiline Doe there were not enough cells for additional testing. He currently has a PleurX catheter in place. Home health is managing this. 200 cc's were taken off yesterday.    He had an issue filling his pain medication due to prior-authorization on Saturday from [place]. He states, "Don't even suggest a pain management program". He is not interested in long acting oxycodone.   He has not lost weight, rather he has gained. His weight does fluctuate. He reports his appetite as good.   He would like to be able to do stuff other than sit around. The pain keeps him from leaving his house. He is unable to leave his house to shop or go out to eat. The pain in mostly in his left hip and the L4-L5 region of his back. In the mornings, his left leg is very stiff from the left hip pain. He is currently taking four to five hydrocodone 10-325 daily. The pain subsides after about 45 minutes and the medication lasts about 1.5-2.5 hours. He is apprehensive about staying in the hospital for pain management, "I hate hospitals".   His trouble walking is mostly from the severe pain rather than weakness. He has no problems controlling his bowels or urine. He does not move his bowels daily but other than that he has no issue. He eats a lot of fruit to avoid constipation.   He is concerned about the financial expense of treatment. He is not happy about [facility] closing. He is not interested in seeing medical oncology in [place].   His PCP is Dr. Emelda Fear in [place].   The patient is here for further discussion and evaluation of newly diagnosed metastatic adenocarcinoma of unknown origin including  further diagnostic studies and future treatment options.    MEDICAL HISTORY:  Past Medical History  Diagnosis Date  . Lung cancer ([place])   . Diabetes mellitus without complication ([place])   . Hypertension   . Arthritis   . Blood transfusion without reported diagnosis   . Cataract     SURGICAL HISTORY: Past Surgical History  Procedure Laterality Date  . Cholecystectomy      2001  . Back surgeries  L4/L5 3 surgeries with last surgery being a fusion  . Cataract extraction  Bilateral   . Melanoma excision Right     right side of face in 2014    SOCIAL HISTORY: Social History   Social History  . Marital Status: Married    Spouse Name: N/A  . Number of Children: N/A  . Years of Education: N/A   Occupational History  . Not on file.   Social History Main Topics  . Smoking status: Former Smoker -- 1.50 packs/day for 33 years    Types: Cigarettes    Quit date: 09/13/2000  . Smokeless tobacco: Not on file  . Alcohol Use: No  . Drug Use: No  . Sexual Activity: Not on file   Other Topics Concern  . Not on file   Social History Narrative  . 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 He was born and raised here [place]).  Ex smoker, quit about 15 years ago ETOH, "I drank" but never dependent on it. Quit drinking about 18 years ago for his wife.  FAMILY HISTORY: Family History  Problem Relation Age of Onset  . Prostate cancer Brother   . Prostate cancer Brother    Mother deceased at 78 yo  Father deceased at 51 yo. Heart and kidney failure. "He had everything wrong with him". 2 surviving younger brothers, both with prostate cancer.  0 deceased siblings  ALLERGIES:  has no allergies on file.  MEDICATIONS:  No current outpatient prescriptions on file.   No current facility-administered medications for this visit.    Review of Systems  Constitutional: Positive for malaise/fatigue. Negative for fever, chills and weight loss.       Severe Pain  HENT: Negative.  Negative for congestion, hearing loss, nosebleeds, sore throat and tinnitus.   Eyes: Negative.  Negative for blurred vision, double vision, pain and discharge.  Respiratory: Positive for shortness of breath. Negative for cough, hemoptysis, sputum production and wheezing.   Cardiovascular: Negative.  Negative for chest pain, palpitations, claudication, leg swelling and PND.    Gastrointestinal: Positive for constipation. Negative for heartburn, nausea, vomiting, abdominal pain, diarrhea, blood in stool and melena.       Constipation managed with fruit intake.  Genitourinary: Negative.  Negative for dysuria, urgency, frequency and hematuria.  Musculoskeletal: Positive for back pain and joint pain. Negative for myalgias and falls.       Severe back and hip pain. Unable to walk due to pain.  Skin: Negative.  Negative for itching and rash.  Neurological: Negative.  Negative for dizziness, tingling, tremors, sensory change, speech change, focal weakness, seizures, loss of consciousness, weakness and headaches.  Endo/Heme/Allergies: Negative.  Does not bruise/bleed easily.  Psychiatric/Behavioral: Negative.  Negative for depression, suicidal ideas, memory loss and substance abuse. The patient is not nervous/anxious and does not have insomnia.   All other systems reviewed and are negative. 14 point ROS was done and is otherwise as detailed above  or in HPI   PHYSICAL EXAMINATION: ECOG PERFORMANCE STATUS: 2 - Symptomatic, <50% confined to bed  Filed Vitals:   01/15/16 1426  BP: 114/59  Pulse: 75  Temp: 99.3 F (37.4 C)  Resp: 20   Filed Weights   01/15/16 1426  Weight: 268 lb 12.8 oz (121.927 kg)     Physical Exam  Constitutional: He is oriented to person, place, and time. He appears distressed.  Obese. Wears glasses. Presents in wheelchair. Able to get on examination table with assistance. This movement caused him visible pain. Unable to lay flat on exam table.  HENT:  Head: Normocephalic and atraumatic.  Mouth/Throat: Oropharynx is clear and moist.  Eyes: Conjunctivae and EOM are normal. Pupils are equal, round, and reactive to light. No scleral icterus.  Neck: Normal range of motion. Neck supple. No tracheal deviation present. No thyromegaly present.  Cardiovascular: Normal rate, regular rhythm and normal heart sounds.   Pulmonary/Chest: Effort normal.   Decreased left. Unable to take really deep breaths. Pleurx catheter on L, dressed  Abdominal: Soft. Bowel sounds are normal. He exhibits distension. He exhibits no mass. There is no tenderness. There is no rebound and no guarding.  Musculoskeletal: Normal range of motion.  Needs assistance onto and off of exam table. Cannot lie back onto the exam table secondary to pain  Lymphadenopathy:    He has no cervical adenopathy.  Neurological: He is alert and oriented to person, place, and time. No cranial nerve deficit.  Skin: Skin is warm and dry.  Nursing note and vitals reviewed.   LABORATORY DATA:  I have reviewed the data as listed Lab Results  Component Value Date   WBC 13.3* 02/20/2010   HGB 15.0 02/25/2010   HCT 44.0 02/25/2010   MCV 88.3 02/20/2010   PLT 231 02/20/2010   CMP     Component Value Date/Time   NA 139 02/25/2010 0928   K 4.6 02/25/2010 0928   CL 98 02/20/2010 1423   CO2 27 02/20/2010 1423   GLUCOSE 248* 02/25/2010 0928   BUN 22 02/20/2010 1423   CREATININE 1.19 02/20/2010 1423   CALCIUM 10.8* 02/20/2010 1423   PROT 6.5 06/28/2008 1300   ALBUMIN 4.1 06/28/2008 1300   AST 25 06/28/2008 1300   ALT 37 06/28/2008 1300   ALKPHOS 66 06/28/2008 1300   BILITOT 0.5 06/28/2008 1300   GFRNONAA >60 02/20/2010 1423   GFRAA  02/20/2010 1423    >60        The eGFR has been calculated using the MDRD equation. This calculation has not been validated in all clinical situations. eGFR's persistently <60 mL/min signify possible Chronic Kidney Disease.     RADIOGRAPHIC STUDIES: I have personally reviewed the radiological images as listed and agreed with the findings in the report. No results found.  Study Result     CLINICAL DATA: Initial treatment strategy for unknown primary malignancy, metastatic to lung.  EXAM: NUCLEAR MEDICINE PET SKULL BASE TO THIGH  TECHNIQUE: 12.5 mCi F-18 FDG was injected intravenously. Full-ring PET imaging was performed from  the skull base to thigh after the radiotracer. CT data was obtained and used for attenuation correction and anatomic localization.  FASTING BLOOD GLUCOSE: Value: 146 mg/dl  COMPARISON: CT abdomen pelvis 01/01/2016 and CT chest 12/30/2015.  FINDINGS: NECK  No hypermetabolic lymph nodes in the neck. CT images show no acute findings.  CHEST  Low left internal jugular lymph node measures 10 mm (CT image 46) with an SUV max of  21.6. There are hypermetabolic left internal mammary, mediastinal and subcarinal lymph nodes. Index AP window lymph node measures 9 mm (CT image 68) with an SUV max of 9.1. Subcarinal lymph node measures approximately 9 mm in short axis with an SUV max of 16.0. There is hypermetabolism associated with nodular pleural thickening in the mid and lower left hemi thorax. Patchy hypermetabolism within left lower lobe collapse/ consolidation with a focal area of associated hypermetabolism with an SUV max of 26.7, likely corresponding to CT image 95. CT images show a moderate to large loculated left pleural effusion. Three-vessel coronary artery calcification.  ABDOMEN/PELVIS  Multiple hypermetabolic lesions in the liver, better seen on 01/01/2016. Index lesion in the central liver has an SUV max of 8.1. Slight thickening of the body of the right adrenal gland measuring with an SUV max of 5.2. Gastrohepatic ligament lymph node measures 11 mm (CT image 116) with an SUV max of 7.8. No abnormal hypermetabolism in the left adrenal gland, spleen or pancreas. No additional hypermetabolic lymph nodes. CT images show the liver, adrenal glands, spleen, pancreas, stomach and bowel to be otherwise grossly unremarkable. Cholecystectomy. No free fluid. Atherosclerotic calcification of the arterial vasculature.  SKELETON  There are scattered hypermetabolic lesions in the skeleton. Index lytic lesion in the right sacrum measures 13 mm (CT image 172) with an SUV  max of 19.5.  IMPRESSION: Metastatic disease involving intrathoracic and upper abdominal lymph nodes, left hemi thorax, liver, right adrenal gland and bones. Given focal hypermetabolism within left lower lobe consolidation, primary malignancy may be bronchogenic in origin.   Electronically Signed  By: Lorin Picket M.D.  On: 01/08/2016 12:34    ASSESSMENT & PLAN:  Stage IV adenocarcinoma of unknown origin Cancer related Pain Left Pleural Effusion Pleurx Catheter placement History of Tobacco Use  The patient is here for further discussion and evaluation of newly diagnosed metastatic adenocarcinoma of unknown origin including further diagnostic studies, pain management, and future treatment options.   I discussed with the patient and family the following 1. He has stage IV disease which is uncurable, disease histology is an adenocarcinoma but at this time we are uncertain as to origin 2. He will need another biopsy for further identification of origin and testing such as foundation one. If a lung primary; testing for PDL-1, EGFR and ALK. This is necessary to help guide therapy. 3. I would recommend admission for pain control. His pain is poorly controlled. I would start a PCA until he is comfortable and convert to oral long acting and short acting morphine at discharge. He is very frustrated that he could not get his pain medication filled that Dr. Jacquiline Doe prescribed because it required a PA. I advised him that this is an insurance issue. We may need to consider a fentanyl patch at discharge to avoid this problem initially at discharge 4. Palliative XRT to back/hip if needed. Will need to review PET/CT to see if there is evidence of metastatic disease in this area that could be explaining his severe LBP. He has a known history of LBP and prior surgery.  5. He expressed a lot of concerns regarding cost of care. We will address at follow-up. I offered to refer him closer to his home  but he does not want to receive care in [place]. If this continues to be a concern we can discuss immediate referral to palliative care/hospice.  6. F/U after discharge to re-assess pain control and for path review   ORDERS PLACED FOR THIS  ENCOUNTER: Orders Placed This Encounter  Procedures  . US Biopsy    All questions were answered. The patient knows to call the clinic with any problems, questions or concerns.  This document serves as a record of services personally performed by Ancil Linsey, MD. It was created on her behalf by Arlyce Harman, a trained medical scribe. The creation of this record is based on the scribe's personal observations and the provider's statements to them. This document has been checked and approved by the attending provider.  I have reviewed the above documentation for accuracy and completeness, and I agree with the above.  This note was electronically signed.    Molli Hazard, MD  01/15/2016 2:40 PM

## 2016-01-15 NOTE — Patient Instructions (Addendum)
[place] at [facility] Discharge Instructions  RECOMMENDATIONS MADE BY THE CONSULTANT AND ANY TEST RESULTS WILL BE SENT TO YOUR REFERRING PHYSICIAN.  You have an adenocarcinoma type of cancer.   This is probably not a liver cancer even though you have cancer in the liver. We have to identify where it has started. We have to do more tests to know what we should treat you with. There are more targeted therapies today and that is why we have to do further testing. We have to identify where it came from so we can treat you accordingly.  If this is a lung cancer we can test it for PDL1. If you were PDL1+ you would qualify for a treatment called immunotherapy. This works differently than chemotherapy.   The above example is being given to just say that there are different options depending upon where the cancer has started.   We are setting you up for a liver biopsy. This will be done in [place]. Once they get the tissue from your liver a pathologist will look at this tissue under a microscope to identify where it came from. Then it will also be sent off for additional testing. This can take up to 10 business days for the tissue to come back.   More than likely the biopsy of the liver will take place @ [facility] in the Interventional Radiology Department.   This is a Stage IV Cancer because it is in multiple areas (liver, bone, lymph nodes, lung)          Thank you for choosing [facility] at [facility] to provide your oncology and hematology care.  To afford each patient quality time with our provider, please arrive at least 15 minutes before your scheduled appointment time.   Beginning January 23rd 2017 lab work for the [facility] will be done in the  Main lab at [facility] on 1st floor. If you have a lab appointment with the [facility] please come in thru the  Main Entrance and check in at the main information  desk  You need to re-schedule your appointment should you arrive 10 or more minutes late.  We strive to give you quality time with our providers, and arriving late affects you and other patients whose appointments are after yours.  Also, if you no show three or more times for appointments you may be dismissed from the clinic at the providers discretion.     Again, thank you for choosing [facility].  Our hope is that these requests will decrease the amount of time that you wait before being seen by our physicians.       _____________________________________________________________  Should you have questions after your visit to [facility], please contact our office at [phone] between the hours of 8:30 a.m. and 4:30 p.m.  Voicemails left after 4:30 p.m. will not be returned until the following business day.  For prescription refill requests, have your pharmacy contact our office.         Resources For Cancer Patients and their Caregivers ? American Cancer Society: Can assist with transportation, wigs, general needs, runs Look Good Feel Better.        ([phone] ? Cancer Care: Provides financial assistance, online support groups, medication/co-pay assistance.  1-800-813-HOPE (737)375-6360) ? Bernie Assists [place] cancer patients and their families through emotional , educational and financial support.  504-493-5039 ?  [place] DSS Where to apply for food stamps, Medicaid and utility assistance. 725-059-1716 ? [facility]: Transportation to medical appointments. 608-132-2343 ? Social Security Administration: May apply for disability if have a Stage IV cancer. 772-712-2420 586-403-9492 ? [employer], Disability and Transit Services: Assists with nutrition, care and transit needs. [place] Support Programs: @10RELATIVEDAYS @ > Cancer Support Group  2nd Tuesday of the month 1pm-2pm, Journey Room  >  Creative Journey  3rd Tuesday of the month 1130am-1pm, Journey Room  > Look Good Feel Better  1st Wednesday of the month 10am-12 noon, Journey Room (Call [facility] to register [phone])

## 2016-01-16 ENCOUNTER — Encounter (HOSPITAL_COMMUNITY): Payer: Self-pay | Admitting: Hematology & Oncology

## 2016-01-16 DIAGNOSIS — G893 Neoplasm related pain (acute) (chronic): Principal | ICD-10-CM

## 2016-01-16 LAB — URINE MICROSCOPIC-ADD ON
BACTERIA UA: NONE SEEN
SQUAMOUS EPITHELIAL / LPF: NONE SEEN
WBC, UA: NONE SEEN WBC/hpf (ref 0–5)

## 2016-01-16 LAB — GLUCOSE, CAPILLARY
GLUCOSE-CAPILLARY: 112 mg/dL — AB (ref 65–99)
Glucose-Capillary: 152 mg/dL — ABNORMAL HIGH (ref 65–99)
Glucose-Capillary: 247 mg/dL — ABNORMAL HIGH (ref 65–99)
Glucose-Capillary: 128 mg/dL — ABNORMAL HIGH (ref 65–99)

## 2016-01-16 LAB — HEMOGLOBIN A1C
Hgb A1c MFr Bld: 6.9 % — ABNORMAL HIGH (ref 4.8–5.6)
MEAN PLASMA GLUCOSE: 151 mg/dL

## 2016-01-16 LAB — URINALYSIS, ROUTINE W REFLEX MICROSCOPIC
Bilirubin Urine: NEGATIVE
Glucose, UA: NEGATIVE mg/dL
Ketones, ur: NEGATIVE mg/dL
LEUKOCYTES UA: NEGATIVE
NITRITE: NEGATIVE
PH: 6 (ref 5.0–8.0)
Protein, ur: NEGATIVE mg/dL
SPECIFIC GRAVITY, URINE: 1.01 (ref 1.005–1.030)

## 2016-01-16 MED ORDER — MORPHINE SULFATE 2 MG/ML IV SOLN
INTRAVENOUS | Status: DC
  Administered 2016-01-16: 19:00:00 via INTRAVENOUS
  Administered 2016-01-17: 2 mg via INTRAVENOUS
  Administered 2016-01-17: 10 mg via INTRAVENOUS
  Administered 2016-01-17: 4 mg via INTRAVENOUS
  Administered 2016-01-17: 6 mg via INTRAVENOUS
  Administered 2016-01-18: 10:00:00 via INTRAVENOUS
  Administered 2016-01-18: 2 mg via INTRAVENOUS
  Administered 2016-01-18: 6 mg via INTRAVENOUS
  Filled 2016-01-16 (×2): qty 25

## 2016-01-16 NOTE — Care Management Note (Signed)
.  Case Management Note  Patient Details  Name: Walter Berg MRN: [id number] Date of Birth: [birth year]  Subjective/Objective:   Patient is alert and orient from home and answers questions appropriately, Lives with spouse who drives .  Describes self as independent, uses no assitive devices to walk, Meds OK no issues. PCP is Dr Emelda Fear. Has HH for pleurodex cath. Doesn't know company. Restart HH at discharge.                 Action/Plan:Home with [facility]   Expected Discharge Date:  01/15/16               Expected Discharge Plan:  Home/Self Care  In-House Referral:     Discharge planning Services  CM Consult  Post Acute Care Choice:    Choice offered to:     DME Arranged:    DME Agency:     HH Arranged:    HH Agency:     Status of Service:  Completed, signed off  Medicare Important Message Given:    Date Medicare IM Given:    Medicare IM give by:    Date Additional Medicare IM Given:    Additional Medicare Important Message give by:     If discussed at [place] of Stay Meetings, dates discussed:    Additional Comments:  Alvie Heidelberg, RN 01/16/2016, 4:19 PM

## 2016-01-16 NOTE — Progress Notes (Signed)
Subjective: Patient seen in bed laying on his side.  Nurse is present in the patient's room.  Patient is very disgruntled about his pain PCA.  He was previously on Hydrocodone 4-5 times per day.  He was seen in the clinic in consultation yesterday reporting miserable pain.  He was subsequently admitted for pain management.  He was started on a PCA with 2 mg loading dose and 0.5 mg every 15 minutes.  We have calculated his dose.   He was very disgruntled today.  He claims that he is not getting pain medication via PCA in the appropriate fashion.  He claims that when he pushes the button, he does not receive morphine.  I have discussed the patient's pain medication use via PCA.    He projected blame on to me, [facility], etc for his pain and current situation. He blames [place] for giving him a PCA button that lights up green and when he pushes it, it does not give him pain medication.  He is educated that the PCA has a "lock out" period in which he cannot get pain medication delivered.  "Well then the light shouldn't turn on."  I pointed out the manufacturer of the PCA and he is advised to voice his complaints to them as I, nor [place], invented, created, or manufactured the PCA pump currently in use.  Thus, his blame and anger is not warranted.  He reports that his pain is no better.  He reports that his pain is constantly a 2/10 secondary to back surgery.  When asked about his pain, "HELL YEAH I'M IN PAIN."  He is informed that the PCA is not to eliminate his pain, it is to decrease his self-reported 9/10 pain level yesterday in the clinic in an attempt to allow his pain to be at a more manageable level.   Unfortunately, his anger increased and I left the patient's room.    Objective: Vital signs in last 24 hours: Temp:  [98.8 F (37.1 C)] 98.8 F (37.1 C) (06/01 0602) Pulse Rate:  [76-77] 76 (06/01 1437) Resp:  [13-20] 20 (06/01 1437) BP: (139-142)/(69-72) 139/69 mmHg (06/01  1437) SpO2:  [93 %-98 %] 93 % (06/01 1437) FiO2 (%):  [0 %] 0 % (06/01 1036)  Intake/Output from previous day: 05/31 0800 - 06/01 0759 In: 240 [P.O.:240] Out: 300 [Urine:300] Intake/Output this shift: Total I/O In: 480 [P.O.:480] Out: 350 [Urine:350]  General appearance: alert, combative, no distress, moderately obese and angered, not in acute pain, not appearing to be in pain.  Lab Results:   Recent Labs  01/15/16 1809  WBC 13.9*  HGB 13.2  HCT 41.4  PLT 299   BMET  Recent Labs  01/15/16 1809  NA 135  K 3.8  CL 103  CO2 25  GLUCOSE 75  BUN 28*  CREATININE 1.15  CALCIUM 8.3*    Studies/Results: No results found.  Medications: I have reviewed the patient's current medications.  Assessment/Plan: Stage IV adenocarcinoma of unknown origin.  Yesterday. 01/15/2016 at 1624 hours I placed an order for an US biopsy.  This was to be arranged by IR and unfortunately on 01/16/16 at 0726 hours, Rodney Cruise cancelled this order and was acknowledged by Linus Orn at [phone] hours.  As a result, this procedure was not arranged by IR.  NEW ORDER IS PLACED FOR US BIOPSY FOR IR.  DO NOT CANCEL OR CHANGE WITHOUT CONSULTING ONCOLOGY.  I have called IR and spoken to Dr. Vernard Gambles.  He requested I make the patient NPO at midnight tonight and he will help facility transfer for biopsy.  I have also discontinued his Lovenox for tomorrow and this will need to be restarted following procedure.  Cancer related Pain.  Patient reports taking 4-5 Hydrocodone tablets at home.  He reported yesterday in the clinic that his pain was poorly controlled.  Today, after being on PCA with significant increase in pain medication, he denies any improved pain.  Unfortunately, conversation turned very hostile and I therefore walked out of the room.  Following changes will be made to PCA:  Increase bolus dose to 0.75 mg  Change length of lock-out time to 10 minutes  Once diagnosis is made, will need to discuss with  the patient if and where he would like his treatment.  Today, he is advised that given his attitude, we could discharge him home with Hospice or discharge him and refer him elsewhere as he implied on multiple occassions that his care has been suboptimal.  His attitude and mannerisms have been unacceptable to Dr. Whitney Muse and myself.  If continued, he will be discharged from the [facility].  Patient and plan discussed with Dr.Penland and she is in agreement with the aforementioned.     LOS: 1 day   Unfortunately as above. Patient also very frustrated in clinic when I first met him, angry about delay in getting a diagnosis. Frustrated about his pain. He was unable to get onto the exam table, unable to get out of his wheelchair in the clinic because of pain. Current pain level 0/98 is certainly a marked improvement as goal is not to get to zero pain, but manageable pain and to convert him to a reasonable outpatient regimen. Biopsy scheduled while inpatient and if converted to oral pain regimen or fentanyl plus oral for breakthrough and comfortable can be discharged once biopsy complete. If patient remains hostile to staff, myself or Gershon Mussel will have to help him arrange for care elsewhere, Donald Pore MD   [facility] 01/16/2016 5:06 PM

## 2016-01-17 ENCOUNTER — Ambulatory Visit (HOSPITAL_COMMUNITY): Payer: Medicare Other

## 2016-01-17 DIAGNOSIS — Z794 Long term (current) use of insulin: Secondary | ICD-10-CM

## 2016-01-17 DIAGNOSIS — R52 Pain, unspecified: Secondary | ICD-10-CM

## 2016-01-17 DIAGNOSIS — C799 Secondary malignant neoplasm of unspecified site: Secondary | ICD-10-CM

## 2016-01-17 DIAGNOSIS — I10 Essential (primary) hypertension: Secondary | ICD-10-CM

## 2016-01-17 DIAGNOSIS — E119 Type 2 diabetes mellitus without complications: Secondary | ICD-10-CM

## 2016-01-17 LAB — GLUCOSE, CAPILLARY
GLUCOSE-CAPILLARY: 82 mg/dL (ref 65–99)
Glucose-Capillary: 83 mg/dL (ref 65–99)
Glucose-Capillary: 209 mg/dL — ABNORMAL HIGH (ref 65–99)
Glucose-Capillary: 113 mg/dL — ABNORMAL HIGH (ref 65–99)

## 2016-01-17 LAB — CBC
HCT: 44 % (ref 39.0–52.0)
Hemoglobin: 13.9 g/dL (ref 13.0–17.0)
MCH: 28.4 pg (ref 26.0–34.0)
MCHC: 31.6 g/dL (ref 30.0–36.0)
MCV: 90 fL (ref 78.0–100.0)
PLATELETS: 295 10*3/uL (ref 150–400)
RBC: 4.89 MIL/uL (ref 4.22–5.81)
RDW: 13.6 % (ref 11.5–15.5)
WBC: 13.3 10*3/uL — ABNORMAL HIGH (ref 4.0–10.5)

## 2016-01-17 LAB — URINE CULTURE: Culture: NO GROWTH

## 2016-01-17 MED ORDER — ENOXAPARIN SODIUM 60 MG/0.6ML ~~LOC~~ SOLN
60.0000 mg | SUBCUTANEOUS | Status: DC
  Administered 2016-01-17: 60 mg via SUBCUTANEOUS
  Filled 2016-01-17: qty 0.6

## 2016-01-17 MED ORDER — MORPHINE SULFATE ER 30 MG PO TBCR
30.0000 mg | EXTENDED_RELEASE_TABLET | Freq: Two times a day (BID) | ORAL | Status: DC
  Administered 2016-01-17 – 2016-01-18 (×3): 30 mg via ORAL
  Filled 2016-01-17 (×3): qty 1

## 2016-01-17 NOTE — Progress Notes (Signed)
PROGRESS NOTE    Walter Berg  HWE:[id number] DOB: 1945-11-22 DOA: 01/15/2016 PCP: Emelda Fear DO     Brief Narrative:  70 year old man admitted as a direct admission from the oncology office on 5/31 with complaints of intractable pain. He has newly diagnosed adenocarcinoma of unknown primary with a PET scan showing metastatic disease involving the thoracic and upper abdominal lymph nodes, left hemithorax, liver, right adrenal gland and bones. He was in significant pain and hence was referred as a direct admission for pain management.   Assessment & Plan:   Principal Problem:   Intractable pain Active Problems:   Metastatic adenocarcinoma (HCC)   Essential hypertension   Insulin dependent diabetes mellitus (HCC)   Intractable pain -Patient has both acute and chronic pain. His chronic pain is due to chronic back injury and his acute pain is likely bone pain from his metastatic cancer. -He states that his pain for the past 8-10 years has been at a level of 3-4 but was a 10 on day of admission. He states his pain is down to maybe a 6 with the PCA pump. -He had some concerns about the function of the PCA pump yesterday and thought that some of his care team thought he was "lying" about his pain. -He seems much calmer today and admits to improvement in pain management. -We'll plan to initiate long-acting morphine, keep PCA pump running today with plans to start weaning over the weekend and transition to oral medications for breakthrough pain.  Metastatic adenocarcinoma of unknown primary -Patient states he has had 2 recent hospitalizations at [facility] during which he developed respiratory failure due to pleural effusion and after 2 thoracentesis decision was made to place a left-sided Pleurx catheter. It was cytology from this thoracentesis that was positive for adenocarcinoma. -He has an appointment with IR on Monday for biopsy for further tissue diagnosis.  -will need to follow-up  with Dr. Whitney Muse for further treatment plan.  Hypertension -Control  Insulin-dependent diabetes -Well-controlled   DVT prophylaxis: Lovenox Code Status: Full code Family Communication: Son and daughter at bedside updated on plan of care and all questions answered Disposition Plan: Discharge home once pain at an acceptable level, to have biopsy by IR either as an inpatient or outpatient depending on how pain evolves over the weekend.  Consultants:   Oncology  Procedures:   None  Antimicrobials:   None    Subjective: Feels improved,  Objective: Filed Vitals:   01/17/16 0643 01/17/16 0800 01/17/16 1200 01/17/16 1322  BP: 162/72   132/59  Pulse: 79   69  Temp: 98.1 F (36.7 C)   98.6 F (37 C)  TempSrc: Oral   Oral  Resp: '16 18 16 16  '$ Height:      Weight:      SpO2: 96% 95% 98% 94%    Intake/Output Summary (Last 24 hours) at 01/17/16 1455 Last data filed at 01/17/16 1200  Gross per 24 hour  Intake    510 ml  Output    550 ml  Net    -40 ml   Filed Weights   01/15/16 1619  Weight: 121.9 kg (268 lb 11.9 oz)    Examination:  General exam: Alert, awake, oriented x 3 Respiratory system: Clear to auscultation. Respiratory effort normal. Cardiovascular system:RRR. No murmurs, rubs, gallops. Gastrointestinal system: Abdomen is nondistended, soft and nontender. No organomegaly or masses felt. Normal bowel sounds heard. Central nervous system: Alert and oriented. No focal neurological deficits. Extremities: No  C/C/E, +pedal pulses Skin: No rashes, lesions or ulcers Psychiatry: Judgement and insight appear normal. Mood & affect appropriate.     Data Reviewed: I have personally reviewed following labs and imaging studies  CBC:  Recent Labs Lab 01/15/16 1809 01/17/16 0640  WBC 13.9* 13.3*  NEUTROABS 8.7*  --   HGB 13.2 13.9  HCT 41.4 44.0  MCV 89.6 90.0  PLT 299 250   Basic Metabolic Panel:  Recent Labs Lab 01/15/16 1809  NA 135  K 3.8  CL  103  CO2 25  GLUCOSE 75  BUN 28*  CREATININE 1.15  CALCIUM 8.3*   GFR: Estimated Creatinine Clearance: 81.7 mL/min (by C-G formula based on Cr of 1.15). Liver Function Tests:  Recent Labs Lab 01/15/16 1809  AST 16  ALT 15*  ALKPHOS 63  BILITOT 0.2*  PROT 7.0  ALBUMIN 3.3*   No results for input(s): LIPASE, AMYLASE in the last 168 hours. No results for input(s): AMMONIA in the last 168 hours. Coagulation Profile:  Recent Labs Lab 01/15/16 1809  INR 0.91   Cardiac Enzymes: No results for input(s): CKTOTAL, CKMB, CKMBINDEX, TROPONINI in the last 168 hours. BNP (last 3 results) No results for input(s): PROBNP in the last 8760 hours. HbA1C:  Recent Labs  01/15/16 1809  HGBA1C 6.9*   CBG:  Recent Labs Lab 01/16/16 1111 01/16/16 1657 01/16/16 2107 01/17/16 0729 01/17/16 1125  GLUCAP 247* 128* 112* 83 82   Lipid Profile: No results for input(s): CHOL, HDL, LDLCALC, TRIG, CHOLHDL, LDLDIRECT in the last 72 hours. Thyroid Function Tests: No results for input(s): TSH, T4TOTAL, FREET4, T3FREE, THYROIDAB in the last 72 hours. Anemia Panel: No results for input(s): VITAMINB12, FOLATE, FERRITIN, TIBC, IRON, RETICCTPCT in the last 72 hours. Urine analysis:    Component Value Date/Time   COLORURINE YELLOW 01/16/2016 0630   APPEARANCEUR CLEAR 01/16/2016 0630   LABSPEC 1.010 01/16/2016 0630   PHURINE 6.0 01/16/2016 0630   GLUCOSEU NEGATIVE 01/16/2016 0630   HGBUR TRACE* 01/16/2016 0630   BILIRUBINUR NEGATIVE 01/16/2016 0630   KETONESUR NEGATIVE 01/16/2016 0630   PROTEINUR NEGATIVE 01/16/2016 0630   NITRITE NEGATIVE 01/16/2016 0630   LEUKOCYTESUR NEGATIVE 01/16/2016 0630   Sepsis Labs: '@LABRCNTIP'$ (procalcitonin:4,lacticidven:4)  ) Recent Results (from the past 240 hour(s))  Urine culture     Status: None   Collection Time: 01/16/16  6:43 AM  Result Value Ref Range Status   Specimen Description URINE, CLEAN CATCH  Final   Special Requests NONE  Final    Culture NO GROWTH Performed at [facility]   Final   Report Status 01/17/2016 FINAL  Final         Radiology Studies: No results found.      Scheduled Meds: . fluticasone  2 spray Each Nare Daily  . insulin aspart  0-15 Units Subcutaneous TID WC  . insulin aspart  4 Units Subcutaneous TID WC  . insulin detemir  60 Units Subcutaneous BID  . metoprolol tartrate  25 mg Oral BID  . morphine   Intravenous Q4H  . morphine  30 mg Oral Q12H  . polyethylene glycol  17 g Oral Daily  . tamsulosin  0.4 mg Oral Daily   Continuous Infusions: . sodium chloride 1,000 mL (01/16/16 2129)     LOS: 2 days    Time spent: 25 minutes. Greater than 50% of this time was spent in direct contact with the patient coordinating care.     Lelon Frohlich, MD [facility] Pager [phone]  If 7PM-7AM, please contact night-coverage [email or web address] Password [email or web address] 01/17/2016, 2:55 PM

## 2016-01-17 NOTE — Progress Notes (Signed)
Approx 75 cc of serosanguinous fluid drained from PleurX catheter.  Patient tolerated well, new dressing applied.  Slight redness around site noted.

## 2016-01-17 NOTE — Progress Notes (Signed)
Patient ID: Walter Berg, male   DOB: 1945/09/25, 70 y.o.   MRN: [id number]   Request made for biopsy for tissue diagnosis for this pt per Dr Whitney Muse  Imaging has been reviewed with Dr Earleen Newport He approves left cervical LN bx  Scheduled for 6/5 in [facility] Pt to be in Rad by 1100 am 6/5 via ambulance  RN aware

## 2016-01-18 LAB — GLUCOSE, CAPILLARY
Glucose-Capillary: 82 mg/dL (ref 65–99)
Glucose-Capillary: 156 mg/dL — ABNORMAL HIGH (ref 65–99)

## 2016-01-18 MED ORDER — MORPHINE SULFATE ER 30 MG PO TBCR: 30.0000 mg | EXTENDED_RELEASE_TABLET | Freq: Two times a day (BID) | ORAL | Status: DC

## 2016-01-18 MED ORDER — MORPHINE SULFATE 15 MG PO TABS: 15.0000 mg | ORAL_TABLET | ORAL | Status: DC | PRN

## 2016-01-18 NOTE — Progress Notes (Signed)
Patient discharged home with instructions given on medications,and follow up visits, patient ,and family verbalized understanding. Prescriptions were sent with the patient. Accompanied by staff to an awaiting vehicle.

## 2016-01-18 NOTE — Discharge Summary (Signed)
Physician Discharge Summary  Walter Berg SVX:[id number] DOB: 1945/12/03 DOA: 01/15/2016  PCP: Emelda Fear, DO  Admit date: 01/15/2016 Discharge date: 01/18/2016  Time spent: 45 minutes  Recommendations for Outpatient Follow-up:  -Will be discharged home today at his request. -Has appointment on 6/5 with interventional radiology a [facility] for percutaneous biopsy, following the results of this should have follow-up with Dr. Whitney Muse at the [facility].   Discharge Diagnoses:  Principal Problem:   Intractable pain Active Problems:   Metastatic adenocarcinoma ([place])   Essential hypertension   Insulin dependent diabetes mellitus ([place])   Discharge Condition: Stable and improved  Filed Weights   01/15/16 1619  Weight: 121.9 kg (268 lb 11.9 oz)    History of present illness:  As per Dr. Myna Hidalgo on 5/31: Walter Berg is a 70 y.o. male with medical history significant for hypertension, insulin-dependent diabetes mellitus, and metastatic adenocarcinoma with unknown primary who presents from his oncology clinic as a direct admission for intractable pain. Patient reports recurrent health problems over the past year which included respiratory and urinary tract infections, and severe lumbar and left hip pain. 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 He underwent PET scan on 01/08/2016 which demonstrates metastatic disease involving intrathoracic and upper abdominal lymph nodes, left hemithorax, liver, right adrenal gland, and bones. Based on imaging study, bronchogenic carcinoma is suspected as the primary. Patient was seen in the oncology clinic earlier today to establish care. He was in excruciating pain, barely able to make it onto the exam table due to this. Oncologist contacted the hospitalist service at [facility] for direct admission in order to manage the patient's intractable pain and  obtain a liver biopsy under ultrasound guidance.  Walter Berg has been directly admitted to the medical-surgical unit at [facility] where he has interviewed and examined. He is in no apparent respiratory distress and with stable vital signs. He is in severe pain at this time, and will be admitted for ongoing evaluation and management of intractable pain at the lumbar spine and left hip.   Hospital Course:   Intractable pain -Patient has both acute and chronic pain. His chronic pain is due to chronic back injury and his acute pain is likely bone pain from his metastatic cancer. -Pain was fairly well controlled on MS Contin 30 mg twice daily and PCA pump. Today patient tells me that he must leave today, states that his pain as a 2-3 which is his baseline level of pain. -Unclear at this point what dose of pain medication for breakthrough would be appropriate, have advised that he stay an extra day for [place] to figure this out, however he refuses.  -will add MSIR 15 mg every 4 hours as needed for breakthrough pain postop  Metastatic adenocarcinoma of unknown primary -Patient states he has had 2 recent hospitalizations at [facility] during which he developed respiratory failure due to pleural effusion and after 2 thoracentesis decision was made to place a left-sided Pleurx catheter. It was cytology from this thoracentesis that was positive for adenocarcinoma. -He has an appointment with IR on Monday for biopsy for further tissue diagnosis.  -will need to follow-up with Dr. Whitney Muse for further treatment plan.  Hypertension -Well Controlled  Insulin-dependent diabetes -Well-controlled  Procedures:  None   Consultations:  Oncology  Discharge Instructions  Discharge Instructions    Increase activity slowly    Complete by:  As  directed             Medication List    STOP taking these medications        ALKA-SELTZER PO     HYDROcodone-acetaminophen 10-325 MG tablet  Commonly  known as:  NORCO      TAKE these medications        amLODipine 10 MG tablet  Commonly known as:  NORVASC  Take 10 mg by mouth daily.     FLOMAX 0.4 MG Caps capsule  Generic drug:  tamsulosin  Take 0.4 mg by mouth daily.     insulin lispro 100 UNIT/ML injection  Commonly known as:  HUMALOG  Inject 30 Units into the skin 3 (three) times daily before meals.     LEVEMIR 100 UNIT/ML injection  Generic drug:  insulin detemir  Inject 75 Units into the skin 2 (two) times daily.     losartan-hydrochlorothiazide 100-12.5 MG tablet  Commonly known as:  HYZAAR  Take 1 tablet by mouth daily.     metFORMIN 1000 MG tablet  Commonly known as:  GLUCOPHAGE  Take 1,000 mg by mouth 2 (two) times daily.     metoprolol tartrate 25 MG tablet  Commonly known as:  LOPRESSOR  Take 25 mg by mouth 2 (two) times daily.     morphine 30 MG 12 hr tablet  Commonly known as:  MS CONTIN  Take 1 tablet (30 mg total) by mouth every 12 (twelve) hours.     morphine 15 MG tablet  Commonly known as:  MSIR  Take 1 tablet (15 mg total) by mouth every 4 (four) hours as needed for severe pain.     VICTOZA 18 MG/3ML Sopn  Generic drug:  Liraglutide  Inject 1.8 mg into the skin daily.       Allergies  Allergen Reactions  . Pregabalin Other (See Comments)    Makes pt unable to eat/drink etc  . Sulfa Antibiotics Other (See Comments)    Was 70yrold at the time of the sulfa allergy/doesn't know what it does       Follow-up Information    Follow up with PMolli Hazard MD. Schedule an appointment as soon as possible for a visit in 2 weeks.   Specialties:  Hematology and Oncology, Oncology   Why:  call office to make appointment for 1 week.   Contact information:   [address] [place] [phone]       The results of significant diagnostics from this hospitalization (including imaging, microbiology, ancillary and laboratory) are listed below for reference.    Significant  Diagnostic Studies: Nm Pet Image Restag (ps) Skull Base To Thigh  01/08/2016  CLINICAL DATA:  Initial treatment strategy for unknown primary malignancy, metastatic to lung. EXAM: NUCLEAR MEDICINE PET SKULL BASE TO THIGH TECHNIQUE: 12.5 mCi F-18 FDG was injected intravenously. Full-ring PET imaging was performed from the skull base to thigh after the radiotracer. CT data was obtained and used for attenuation correction and anatomic localization. FASTING BLOOD GLUCOSE:  Value: 146 mg/dl COMPARISON:  CT abdomen pelvis 01/01/2016 and CT chest 12/30/2015. FINDINGS: NECK No hypermetabolic lymph nodes in the neck. CT images show no acute findings. CHEST Low left internal jugular lymph node measures 10 mm (CT image 46) with an SUV max of 21.6. There are hypermetabolic left internal mammary, mediastinal and subcarinal lymph nodes. Index AP window lymph node measures 9 mm (CT image 68) with an SUV max of 9.1. Subcarinal lymph node  measures approximately 9 mm in short axis with an SUV max of 16.0. There is hypermetabolism associated with nodular pleural thickening in the mid and lower left hemi thorax. Patchy hypermetabolism within left lower lobe collapse/ consolidation with a focal area of associated hypermetabolism with an SUV max of 26.7, likely corresponding to CT image 95. CT images show a moderate to large loculated left pleural effusion. Three-vessel coronary artery calcification. ABDOMEN/PELVIS Multiple hypermetabolic lesions in the liver, better seen on 01/01/2016. Index lesion in the central liver has an SUV max of 8.1. Slight thickening of the body of the right adrenal gland measuring with an SUV max of 5.2. Gastrohepatic ligament lymph node measures 11 mm (CT image 116) with an SUV max of 7.8. No abnormal hypermetabolism in the left adrenal gland, spleen or pancreas. No additional hypermetabolic lymph nodes. CT images show the liver, adrenal glands, spleen, pancreas, stomach and bowel to be otherwise grossly  unremarkable. Cholecystectomy. No free fluid. Atherosclerotic calcification of the arterial vasculature. SKELETON There are scattered hypermetabolic lesions in the skeleton. Index lytic lesion in the right sacrum measures 13 mm (CT image 172) with an SUV max of 19.5. IMPRESSION: Metastatic disease involving intrathoracic and upper abdominal lymph nodes, left hemi thorax, liver, right adrenal gland and bones. Given focal hypermetabolism within left lower lobe consolidation, primary malignancy may be bronchogenic in origin. Electronically Signed   By: Lorin Picket M.D.   On: 01/08/2016 12:34    Microbiology: Recent Results (from the past 240 hour(s))  Urine culture     Status: None   Collection Time: 01/16/16  6:43 AM  Result Value Ref Range Status   Specimen Description URINE, CLEAN CATCH  Final   Special Requests NONE  Final   Culture NO GROWTH Performed at [facility]   Final   Report Status 01/17/2016 FINAL  Final     Labs: Basic Metabolic Panel:  Recent Labs Lab 01/15/16 1809  NA 135  K 3.8  CL 103  CO2 25  GLUCOSE 75  BUN 28*  CREATININE 1.15  CALCIUM 8.3*   Liver Function Tests:  Recent Labs Lab 01/15/16 1809  AST 16  ALT 15*  ALKPHOS 63  BILITOT 0.2*  PROT 7.0  ALBUMIN 3.3*   No results for input(s): LIPASE, AMYLASE in the last 168 hours. No results for input(s): AMMONIA in the last 168 hours. CBC:  Recent Labs Lab 01/15/16 1809 01/17/16 0640  WBC 13.9* 13.3*  NEUTROABS 8.7*  --   HGB 13.2 13.9  HCT 41.4 44.0  MCV 89.6 90.0  PLT 299 295   Cardiac Enzymes: No results for input(s): CKTOTAL, CKMB, CKMBINDEX, TROPONINI in the last 168 hours. BNP: BNP (last 3 results) No results for input(s): BNP in the last 8760 hours.  ProBNP (last 3 results) No results for input(s): PROBNP in the last 8760 hours.  CBG:  Recent Labs Lab 01/17/16 1125 01/17/16 1631 01/17/16 2050 01/18/16 0738 01/18/16 1024  GLUCAP 82 209* 113* 82 156*        Signed:  [facility] Pager: [phone] 01/18/2016, 3:27 PM

## 2016-01-18 NOTE — Progress Notes (Signed)
CM was able to contact patient at home to get information for the [employer] services.  Patient was receiving services from [facility] home health.  Contacted the company and spoke with nurse on call, and faxed information for processing.  CM contacted Dr. Jerilee Hoh regarding medication and a follow up will be done to the pharmacy and patient.

## 2016-01-20 ENCOUNTER — Other Ambulatory Visit (HOSPITAL_COMMUNITY): Payer: Self-pay | Admitting: Oncology

## 2016-01-22 ENCOUNTER — Other Ambulatory Visit: Payer: BLUE CROSS/BLUE SHIELD

## 2016-01-29 ENCOUNTER — Other Ambulatory Visit: Payer: Self-pay | Admitting: General Surgery

## 2016-01-30 ENCOUNTER — Ambulatory Visit (HOSPITAL_COMMUNITY)
Admission: RE | Admit: 2016-01-30 | Discharge: 2016-01-30 | Disposition: A | Discharge status: Home / Self Care | Payer: BLUE CROSS/BLUE SHIELD | Source: Ambulatory Visit | Attending: Hematology & Oncology | Admitting: Hematology & Oncology

## 2016-01-30 ENCOUNTER — Encounter (HOSPITAL_COMMUNITY): Payer: Self-pay

## 2016-01-30 DIAGNOSIS — Z9049 Acquired absence of other specified parts of digestive tract: Secondary | ICD-10-CM | POA: Diagnosis not present

## 2016-01-30 DIAGNOSIS — Z794 Long term (current) use of insulin: Secondary | ICD-10-CM | POA: Insufficient documentation

## 2016-01-30 DIAGNOSIS — C787 Secondary malignant neoplasm of liver and intrahepatic bile duct: Secondary | ICD-10-CM | POA: Diagnosis present

## 2016-01-30 DIAGNOSIS — M1990 Unspecified osteoarthritis, unspecified site: Secondary | ICD-10-CM | POA: Diagnosis not present

## 2016-01-30 DIAGNOSIS — Z8042 Family history of malignant neoplasm of prostate: Secondary | ICD-10-CM | POA: Diagnosis not present

## 2016-01-30 DIAGNOSIS — Z79899 Other long term (current) drug therapy: Secondary | ICD-10-CM | POA: Insufficient documentation

## 2016-01-30 DIAGNOSIS — Z9842 Cataract extraction status, left eye: Secondary | ICD-10-CM | POA: Diagnosis not present

## 2016-01-30 DIAGNOSIS — Z882 Allergy status to sulfonamides status: Secondary | ICD-10-CM | POA: Diagnosis not present

## 2016-01-30 DIAGNOSIS — Z9841 Cataract extraction status, right eye: Secondary | ICD-10-CM | POA: Diagnosis not present

## 2016-01-30 DIAGNOSIS — Z87891 Personal history of nicotine dependence: Secondary | ICD-10-CM | POA: Diagnosis not present

## 2016-01-30 DIAGNOSIS — I10 Essential (primary) hypertension: Secondary | ICD-10-CM | POA: Diagnosis not present

## 2016-01-30 DIAGNOSIS — C7801 Secondary malignant neoplasm of right lung: Secondary | ICD-10-CM | POA: Insufficient documentation

## 2016-01-30 DIAGNOSIS — Z7984 Long term (current) use of oral hypoglycemic drugs: Secondary | ICD-10-CM | POA: Diagnosis not present

## 2016-01-30 DIAGNOSIS — C801 Malignant (primary) neoplasm, unspecified: Secondary | ICD-10-CM | POA: Diagnosis not present

## 2016-01-30 DIAGNOSIS — E119 Type 2 diabetes mellitus without complications: Secondary | ICD-10-CM | POA: Diagnosis not present

## 2016-01-30 DIAGNOSIS — Z886 Allergy status to analgesic agent status: Secondary | ICD-10-CM | POA: Insufficient documentation

## 2016-01-30 LAB — APTT: APTT: 31 s (ref 24–37)

## 2016-01-30 LAB — CBC
HEMATOCRIT: 41.5 % (ref 39.0–52.0)
HEMOGLOBIN: 13.1 g/dL (ref 13.0–17.0)
MCH: 27.5 pg (ref 26.0–34.0)
MCHC: 31.6 g/dL (ref 30.0–36.0)
MCV: 87.2 fL (ref 78.0–100.0)
Platelets: 377 10*3/uL (ref 150–400)
RBC: 4.76 MIL/uL (ref 4.22–5.81)
RDW: 13.8 % (ref 11.5–15.5)
WBC: 14 10*3/uL — AB (ref 4.0–10.5)

## 2016-01-30 LAB — GLUCOSE, CAPILLARY
Glucose-Capillary: 102 mg/dL — ABNORMAL HIGH (ref 65–99)
Glucose-Capillary: 135 mg/dL — ABNORMAL HIGH (ref 65–99)

## 2016-01-30 LAB — PROTIME-INR
INR: 1.09 (ref 0.00–1.49)
Prothrombin Time: 13.9 seconds (ref 11.6–15.2)

## 2016-01-30 MED ORDER — FENTANYL CITRATE (PF) 100 MCG/2ML IJ SOLN
INTRAMUSCULAR | Status: AC
  Filled 2016-01-30: qty 4

## 2016-01-30 MED ORDER — MIDAZOLAM HCL 2 MG/2ML IJ SOLN
INTRAMUSCULAR | Status: AC | PRN
  Administered 2016-01-30: 1 mg via INTRAVENOUS

## 2016-01-30 MED ORDER — FENTANYL CITRATE (PF) 100 MCG/2ML IJ SOLN
INTRAMUSCULAR | Status: AC | PRN
  Administered 2016-01-30: 50 ug via INTRAVENOUS
  Administered 2016-01-30: 25 ug via INTRAVENOUS

## 2016-01-30 MED ORDER — HYDROCODONE-ACETAMINOPHEN 5-325 MG PO TABS
1.0000 | ORAL_TABLET | ORAL | Status: DC | PRN
  Filled 2016-01-30: qty 2

## 2016-01-30 MED ORDER — MIDAZOLAM HCL 2 MG/2ML IJ SOLN
INTRAMUSCULAR | Status: DC
Start: 2016-01-30 — End: 2016-01-31
  Filled 2016-01-30: qty 4

## 2016-01-30 MED ORDER — SODIUM CHLORIDE 0.9 % IV SOLN
INTRAVENOUS | Status: DC
  Administered 2016-01-30: 12:00:00 via INTRAVENOUS

## 2016-01-30 NOTE — Procedures (Signed)
US liver lesion core bx 18g x3 to surg path No complication No blood loss. See complete dictation in [facility]

## 2016-01-30 NOTE — H&P (Signed)
Chief Complaint: Patient was seen in consultation today for liver lesion biopsy at the request of Penland,Shannon K  Referring Physician(s): Patrici Ranks  Supervising Physician: Arne Cleveland  Patient Status: Outpatient  History of Present Illness: Walter MCCANNON is a 70 y.o. male with metastatic adenocarcinoma of unknown primary. He is found to have intrathoracic and liver lesions on recent PET. He is referred for US guided bx of the liver lesions. PMHx, meds, labs, imaging, allergies reviewed. Has been NPO all morning. Family at bedside  Past Medical History  Diagnosis Date  . Lung cancer ([place])   . Diabetes mellitus without complication ([place])   . Hypertension   . Arthritis   . Blood transfusion without reported diagnosis   . Cataract     Past Surgical History  Procedure Laterality Date  . Cholecystectomy      2001  . Back surgeries  L4/L5 3 surgeries with last surgery being a fusion  . Cataract extraction Bilateral   . Melanoma excision Right     right side of face in 2014    Allergies: Pregabalin and Sulfa antibiotics  Medications: Prior to Admission medications   Medication Sig Start Date End Date Taking? Authorizing Provider  amLODipine (NORVASC) 10 MG tablet Take 10 mg by mouth daily.   Yes Historical Provider, MD  insulin detemir (LEVEMIR) 100 UNIT/ML injection Inject 75 Units into the skin 2 (two) times daily.   Yes Historical Provider, MD  insulin lispro (HUMALOG) 100 UNIT/ML injection Inject 30 Units into the skin 3 (three) times daily before meals.   Yes Historical Provider, MD  Liraglutide (VICTOZA) 18 MG/3ML SOPN Inject 1.8 mg into the skin daily.   Yes Historical Provider, MD  metFORMIN (GLUCOPHAGE) 1000 MG tablet Take 1,000 mg by mouth 2 (two) times daily.   Yes Historical Provider, MD  metoprolol tartrate (LOPRESSOR) 25 MG tablet Take 25 mg by mouth 2 (two) times daily.   Yes Historical Provider, MD  morphine (MS CONTIN) 30 MG 12 hr  tablet Take 1 tablet (30 mg total) by mouth every 12 (twelve) hours. 01/18/16  Yes Estela Leonie Green, MD  morphine (MSIR) 15 MG tablet Take 1 tablet (15 mg total) by mouth every 4 (four) hours as needed for severe pain. 01/18/16  Yes Erline Hau, MD  tamsulosin (FLOMAX) 0.4 MG CAPS capsule Take 0.4 mg by mouth daily.   Yes Historical Provider, MD  losartan-hydrochlorothiazide (HYZAAR) 100-12.5 MG tablet Take 1 tablet by mouth daily. 01/02/16   Historical Provider, MD     Family History  Problem Relation Age of Onset  . Prostate cancer Brother   . Prostate cancer Brother     Social History   Social History  . Marital Status: Married    Spouse Name: N/A  . Number of Children: N/A  . Years of Education: N/A   Social History Main Topics  . Smoking status: Former Smoker -- 1.50 packs/day for 33 years    Types: Cigarettes    Quit date: 09/13/2000  . Smokeless tobacco: None  . Alcohol Use: No  . Drug Use: No  . Sexual Activity: Not Asked   Other Topics Concern  . None   Social History Narrative     Review of Systems: A 12 point ROS discussed and pertinent positives are indicated in the HPI above.  All other systems are negative.  Review of Systems  Vital Signs: BP 108/47 mmHg  Pulse 62  Temp(Src) 98.7 F (37.1  C) (Oral)  Resp 16  SpO2 99%  Physical Exam  Constitutional: He is oriented to person, place, and time. He appears well-developed. No distress.  HENT:  Head: Normocephalic.  Mouth/Throat: Oropharynx is clear and moist.  Neck: Normal range of motion. No tracheal deviation present.  Cardiovascular: Normal rate, regular rhythm and normal heart sounds.   Pulmonary/Chest: Effort normal and breath sounds normal. No respiratory distress.  Has left sided chest drain looped under dressing  Neurological: He is alert and oriented to person, place, and time.  Psychiatric: He has a normal mood and affect. Judgment normal.     Mallampati Score:  MD  Evaluation Airway: WNL Heart: WNL Abdomen: WNL Chest/ Lungs: WNL ASA  Classification: 2 Mallampati/Airway Score: One  Imaging: Nm Pet Image Restag (ps) Skull Base To Thigh  01/08/2016  CLINICAL DATA:  Initial treatment strategy for unknown primary malignancy, metastatic to lung. EXAM: NUCLEAR MEDICINE PET SKULL BASE TO THIGH TECHNIQUE: 12.5 mCi F-18 FDG was injected intravenously. Full-ring PET imaging was performed from the skull base to thigh after the radiotracer. CT data was obtained and used for attenuation correction and anatomic localization. FASTING BLOOD GLUCOSE:  Value: 146 mg/dl COMPARISON:  CT abdomen pelvis 01/01/2016 and CT chest 12/30/2015. FINDINGS: NECK No hypermetabolic lymph nodes in the neck. CT images show no acute findings. CHEST Low left internal jugular lymph node measures 10 mm (CT image 46) with an SUV max of 21.6. There are hypermetabolic left internal mammary, mediastinal and subcarinal lymph nodes. Index AP window lymph node measures 9 mm (CT image 68) with an SUV max of 9.1. Subcarinal lymph node measures approximately 9 mm in short axis with an SUV max of 16.0. There is hypermetabolism associated with nodular pleural thickening in the mid and lower left hemi thorax. Patchy hypermetabolism within left lower lobe collapse/ consolidation with a focal area of associated hypermetabolism with an SUV max of 26.7, likely corresponding to CT image 95. CT images show a moderate to large loculated left pleural effusion. Three-vessel coronary artery calcification. ABDOMEN/PELVIS Multiple hypermetabolic lesions in the liver, better seen on 01/01/2016. Index lesion in the central liver has an SUV max of 8.1. Slight thickening of the body of the right adrenal gland measuring with an SUV max of 5.2. Gastrohepatic ligament lymph node measures 11 mm (CT image 116) with an SUV max of 7.8. No abnormal hypermetabolism in the left adrenal gland, spleen or pancreas. No additional hypermetabolic  lymph nodes. CT images show the liver, adrenal glands, spleen, pancreas, stomach and bowel to be otherwise grossly unremarkable. Cholecystectomy. No free fluid. Atherosclerotic calcification of the arterial vasculature. SKELETON There are scattered hypermetabolic lesions in the skeleton. Index lytic lesion in the right sacrum measures 13 mm (CT image 172) with an SUV max of 19.5. IMPRESSION: Metastatic disease involving intrathoracic and upper abdominal lymph nodes, left hemi thorax, liver, right adrenal gland and bones. Given focal hypermetabolism within left lower lobe consolidation, primary malignancy may be bronchogenic in origin. Electronically Signed   By: Lorin Picket M.D.   On: 01/08/2016 12:34    Labs:  CBC:  Recent Labs  01/15/16 1809 01/17/16 0640 01/30/16 1135  WBC 13.9* 13.3* 14.0*  HGB 13.2 13.9 13.1  HCT 41.4 44.0 41.5  PLT 299 295 377    COAGS:  Recent Labs  01/15/16 1809 01/30/16 1135  INR 0.91 1.09    BMP:  Recent Labs  01/15/16 1809  NA 135  K 3.8  CL 103  CO2 25  GLUCOSE 75  BUN 28*  CALCIUM 8.3*  CREATININE 1.15  GFRNONAA >60  GFRAA >60    LIVER FUNCTION TESTS:  Recent Labs  01/15/16 1809  BILITOT 0.2*  AST 16  ALT 15*  ALKPHOS 63  PROT 7.0  ALBUMIN 3.3*    TUMOR MARKERS: No results for input(s): AFPTM, CEA, CA199, CHROMGRNA in the last 8760 hours.  Assessment and Plan: Metastatic cancer, unknown primary For US guided liver lesion bx today Labs pending Risks and Benefits discussed with the patient including, but not limited to bleeding, infection, damage to adjacent structures or low yield requiring additional tests. All of the patient's questions were answered, patient is agreeable to proceed. Consent signed and in chart.   Thank you for this interesting consult.  I greatly enjoyed meeting WESLIE PRETLOW and look forward to participating in their care.  A copy of this report was sent to the requesting provider on this  date.  Electronically Signed: Ascencion Dike 01/30/2016, 12:49 PM   I spent a total of 20 minutes in face to face in clinical consultation, greater than 50% of which was counseling/coordinating care for liver lesion biopsy

## 2016-01-30 NOTE — Discharge Instructions (Signed)
Needle Biopsy, Care After °These instructions give you information about caring for yourself after your procedure. Your doctor may also give you more specific instructions. Call your doctor if you have any problems or questions after your procedure. °HOME CARE °· Rest as told by your doctor. °· Take medicines only as told by your doctor. °· There are many different ways to close and cover the biopsy site, including stitches (sutures), skin glue, and adhesive strips. Follow instructions from your doctor about: °¨ How to take care of your biopsy site. °¨ When and how you should change your bandage (dressing). °¨ When you should remove your dressing. °¨ Removing whatever was used to close your biopsy site. °· Check your biopsy site every day for signs of infection. Watch for: °¨ Redness, swelling, or pain. °¨ Fluid, blood, or pus. °GET HELP IF: °· You have a fever. °· You have redness, swelling, or pain at the biopsy site, and it lasts longer than a few days. °· You have fluid, blood, or pus coming from the biopsy site. °· You feel sick to your stomach (nauseous). °· You throw up (vomit). °GET HELP RIGHT AWAY IF: °· You are short of breath. °· You have trouble breathing. °· Your chest hurts. °· You feel dizzy or you pass out (faint). °· You have bleeding that does not stop with pressure or a bandage. °· You cough up blood. °· Your belly (abdomen) hurts. °  °This information is not intended to replace advice given to you by your health care provider. Make sure you discuss any questions you have with your health care provider. °  °Document Released: 07/16/2008 Document Revised: 12/18/2014 Document Reviewed: 07/30/2014 °Elsevier Interactive Patient Education ©2016 [employer] °Moderate Conscious Sedation, Adult, Care After °Refer to this sheet in the next few weeks. These instructions provide you with information on caring for yourself after your procedure. Your health care provider may also give you more specific  instructions. Your treatment has been planned according to current medical practices, but problems sometimes occur. Call your health care provider if you have any problems or questions after your procedure. °WHAT TO EXPECT AFTER THE PROCEDURE  °After your procedure: °· You may feel sleepy, clumsy, and have poor balance for several hours. °· Vomiting may occur if you eat too soon after the procedure. °HOME CARE INSTRUCTIONS °· Do not participate in any activities where you could become injured for at least 24 hours. Do not: °· Drive. °· Swim. °· Ride a bicycle. °· Operate heavy machinery. °· Cook. °· Use power tools. °· Climb ladders. °· Work from a high place. °· Do not make important decisions or sign legal documents until you are improved. °· If you vomit, drink water, juice, or soup when you can drink without vomiting. Make sure you have little or no nausea before eating solid foods. °· Only take over-the-counter or prescription medicines for pain, discomfort, or fever as directed by your health care provider. °· Make sure you and your family fully understand everything about the medicines given to you, including what side effects may occur. °· You should not drink alcohol, take sleeping pills, or take medicines that cause drowsiness for at least 24 hours. °· If you smoke, do not smoke without supervision. °· If you are feeling better, you may resume normal activities 24 hours after you were sedated. °· Keep all appointments with your health care provider. °SEEK MEDICAL CARE IF: °· Your skin is pale or bluish in color. °· You   continue to feel nauseous or vomit. °· Your pain is getting worse and is not helped by medicine. °· You have bleeding or swelling. °· You are still sleepy or feeling clumsy after 24 hours. °SEEK IMMEDIATE MEDICAL CARE IF: °· You develop a rash. °· You have difficulty breathing. °· You develop any type of allergic problem. °· You have a fever. °MAKE SURE YOU: °· Understand these  instructions. °· Will watch your condition. °· Will get help right away if you are not doing well or get worse. °  °This information is not intended to replace advice given to you by your health care provider. Make sure you discuss any questions you have with your health care provider. °  °Document Released: 05/24/2013 Document Revised: 08/24/2014 Document Reviewed: 05/24/2013 °Elsevier Interactive Patient Education ©2016 [employer] ° °

## 2016-02-04 ENCOUNTER — Telehealth (HOSPITAL_COMMUNITY): Payer: Self-pay | Admitting: Oncology

## 2016-02-04 NOTE — Telephone Encounter (Signed)
This is a late entry from earlier today.  I spoke to [facility] in pathology today at 943 hrs.  I requested PDL1, ALK/EGFR testing.  Robynn Pane, PA-C 02/04/2016 6:11 PM

## 2016-02-10 ENCOUNTER — Encounter (HOSPITAL_COMMUNITY): Payer: Self-pay

## 2016-02-11 ENCOUNTER — Encounter (HOSPITAL_COMMUNITY): Payer: BLUE CROSS/BLUE SHIELD | Attending: Hematology & Oncology | Admitting: Hematology & Oncology

## 2016-02-11 ENCOUNTER — Encounter (HOSPITAL_COMMUNITY): Payer: Self-pay | Admitting: Hematology & Oncology

## 2016-02-11 ENCOUNTER — Encounter (HOSPITAL_COMMUNITY): Payer: Self-pay | Admitting: Oncology

## 2016-02-11 VITALS — BP 147/56 | HR 71 | Temp 97.8°F | Resp 16 | Wt 272.8 lb

## 2016-02-11 DIAGNOSIS — C772 Secondary and unspecified malignant neoplasm of intra-abdominal lymph nodes: Secondary | ICD-10-CM | POA: Diagnosis not present

## 2016-02-11 DIAGNOSIS — C3490 Malignant neoplasm of unspecified part of unspecified bronchus or lung: Secondary | ICD-10-CM | POA: Diagnosis not present

## 2016-02-11 DIAGNOSIS — C7971 Secondary malignant neoplasm of right adrenal gland: Secondary | ICD-10-CM | POA: Diagnosis not present

## 2016-02-11 DIAGNOSIS — C799 Secondary malignant neoplasm of unspecified site: Secondary | ICD-10-CM | POA: Insufficient documentation

## 2016-02-11 DIAGNOSIS — C7951 Secondary malignant neoplasm of bone: Secondary | ICD-10-CM

## 2016-02-11 DIAGNOSIS — M545 Low back pain, unspecified: Secondary | ICD-10-CM

## 2016-02-11 DIAGNOSIS — C787 Secondary malignant neoplasm of liver and intrahepatic bile duct: Secondary | ICD-10-CM

## 2016-02-11 DIAGNOSIS — J90 Pleural effusion, not elsewhere classified: Secondary | ICD-10-CM

## 2016-02-11 DIAGNOSIS — R454 Irritability and anger: Secondary | ICD-10-CM

## 2016-02-11 DIAGNOSIS — M549 Dorsalgia, unspecified: Secondary | ICD-10-CM

## 2016-02-11 MED ORDER — MORPHINE SULFATE ER 30 MG PO TBCR: 30.0000 mg | EXTENDED_RELEASE_TABLET | Freq: Two times a day (BID) | ORAL | Status: DC

## 2016-02-11 MED ORDER — MORPHINE SULFATE 15 MG PO TABS: 15.0000 mg | ORAL_TABLET | ORAL | Status: AC | PRN

## 2016-02-11 MED ORDER — MORPHINE SULFATE ER 30 MG PO TBCR: 30.0000 mg | EXTENDED_RELEASE_TABLET | Freq: Three times a day (TID) | ORAL | Status: AC

## 2016-02-11 MED ORDER — MORPHINE SULFATE 15 MG PO TABS: 15.0000 mg | ORAL_TABLET | ORAL | Status: DC | PRN

## 2016-02-11 NOTE — Progress Notes (Signed)
[place]  PROGRESS NOTE  Patient Care Team: Walter Fear, DO as PCP - General (Family Medicine)  CHIEF COMPLAINTS/PURPOSE OF CONSULTATION:     Metastatic adenocarcinoma ([place])   11/04/2015 Imaging MRI lumbar spine fusion segmen L3-4,L2-3 disc herniation, L4-5 degenerative spondylosis, L5-S1, L predominant disc herniation   01/08/2016 PET scan Metastatic disease involving intrathoracic, upper abdom LN, L hemi thorax, liver, R adrenal gland and bones. Given focal Hypermetab within LLL primary may be bronchogenic   01/30/2016 Pathology Results Metastatic adenocarcinoma, consistent with lung primary, positive for CK7, TTF-1, negative for ck20, cdx 2, napsin A   01/30/2016 Procedure Ultrasound guided core liver lesion biopsy   02/10/2016 Pathology Results PDL1 0%    HISTORY OF PRESENTING ILLNESS:  Walter Berg 70 y.o. male is here for follow-up of stage IV adenocarcinoma. He is accompanied by his wife and daughter. He is in a wheelchair.   His diagnosis has returned as adenocarcinoma of the lung, unfortunately PDL1 negative. EGFR and ALK pending.   When asked what he's been doing to treat his pain, he aggressively remarks that he's "not doing any more for pain than what they put him on last time he was down here."  His daughter says that he is taking morphine 30 and the short-acting 15's. He says accusingly "You didn't prescribe enough of the 30's, only to last 15 days.," adding "we've called and called and called; nobody will even admit that Walter Berg even works here." He consistently interrupts others to get his word across. His wife says that she called the hospital as well and that they denied knowing Walter Berg. She says "I've not been happy with the service so far." His family members are all very dissatisfied with their care at [facility], and were advised that they are free to seek care elsewhere.  His wife continues, saying that they were told he had an  appointment in [place], drove all the way there, and that there was nothing waiting for them. His wife keeps repeating about this cancelled appointment in [place], saying over and over again how dissatisfied she was, with Walter Berg and his daughter chiming in as well. They blame miscommunications with the hospital on their inability to obtain their initial biopsy, saying they "were prepared to do it" at the time.  When asked about his pain management, Walter Berg becomes increasingly hostile. He does not answer in a straightforward fashion, but notes that while he was doing the morphine 30 mg, he was needing short-acting ones in between at each 4 hour interval. He sarcastically remarks that he believes his pain is his old back pain. He notes angrily that it's been giving him problems for the past 6 months. When asked if he has considered other options to treat his pain, he says in a very nasty time of voice that he refuses to take Neurontin. He will not take Lyrica because it makes him feel like a zombie "all the time." He becomes increasingly angry and hostile any time he is spoken to. As the appointment progresses, he lashes out each time he is asked any question, or each time he is prompted to any form of response.  Regarding his pain, his daughter says that when he left the hospital last time, he was "doing really good"   His wife notes that their home nurse thinks that his fluid is gathering in his stomach, because his appetite is lessening, and that the fluid is pressing against his  diaphragm and causing him trouble breathing.  His wife also notes that Walter Berg was just seen by the lung doctor on Thursday, and was totally discharged. Walter Berg refers to this with a vicious tone of voice and attitude.  Back on the topic of different long-acting pain control approaches, Walter Berg aggressively refuses the patch. His wife says that Fentanyl him sick. He continues to belligerently ask for  more hydrocodone, referring to morphine aggressively by saying "I've been on the crap for a month; it has not helped relieve any of the pain, new, old, or otherwise." The topic of morphine causes the patient to become even more aggressive than before. He spits "what is it with you all and morphine?" During attempts to explain the difference between long-acting and fast-acting pain medications, and the warning that hydrocodone contains tylenol, which limits how much he can take, Walter Berg becomes progressively more and more aggressive and defensive in his responses, refusing to accept any of the information.  During this conversation, he pointedly turned his attention to each person in the room, and referred to the need to hire a Chief Executive Officer. He aggressively pointed to the scribe, saying "is she writing down everything [the physician] says and nothing I say? Do I need to hire a lawyer?"  As soon as he turned and directed attentions to [the scribe], the scribe felt threatened. Increasingly throughout the appointment, the scribe felt physically endangered in the presence of the patient. Eventually the physician had to cut the appointment short and leave the room, as the patient's disrespectful behavior crossed a threshold.  Overall, he was very aggressive throughout the duration of the appointment, personally attacking Walter Berg and insulting the care he has received at the hospital. Everything the patient said seemed to be aimed at getting a rise out of anyone present in the room. He persistently blamed his bad behavior on anything other than himself, repeatedly accusing his bad attitude on staff here, treatment of previous doctors, his age, his pain, and particularly the P.A. Walter Berg. The patient was extremely hostile and often spitting as he lashed out toward both Walter Berg and Walter Berg. When asked why he was being so hostile, he openly blamed his extremely aggressive and insulting behavior on his prior  encounter with Walter Berg, excusing his abrasive behavior by saying it was Walter Berg's fault. In spite of his daughter's attempts to calm him down, the patient blatantly refused to cooperate. The patient appears to have no interest in responding to any comment in any way other than insulting, mean, and aggressive. Marland Kitchen  MEDICAL HISTORY:  Past Medical History  Diagnosis Date  . Lung cancer ([place])   . Diabetes mellitus without complication ([place])   . Hypertension   . Arthritis   . Blood transfusion without reported diagnosis   . Cataract     SURGICAL HISTORY: Past Surgical History  Procedure Laterality Date  . Cholecystectomy      2001  . Back surgeries  L4/L5 3 surgeries with last surgery being a fusion  . Cataract extraction Bilateral   . Melanoma excision Right     right side of face in 2014    SOCIAL HISTORY: Social History   Social History  . Marital Status: Married    Spouse Name: N/A  . Number of Children: N/A  . Years of Education: N/A   Occupational History  . Not on file.   Social History Main Topics  . Smoking status: Former Smoker -- 1.50 packs/day for  33 years    Types: Cigarettes    Quit date: 09/13/2000  . Smokeless tobacco: Not on file  . Alcohol Use: No  . Drug Use: No  . Sexual Activity: Not on file   Other Topics Concern  . 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 He was born and raised here [place]).  Ex smoker, quit about 15 years ago ETOH, "I drank" but never dependent on it. Quit drinking about 18 years ago for his wife.  FAMILY HISTORY: Family History  Problem Relation Age of Onset  . Prostate cancer Brother   . Prostate cancer Brother    Mother deceased at 68 yo  Father deceased at 68 yo. Heart and kidney failure. "He had everything wrong with him". 2 surviving younger brothers, both with prostate cancer.  0 deceased  siblings  ALLERGIES:  is allergic to pregabalin and sulfa antibiotics.  MEDICATIONS:  Current Outpatient Prescriptions  Medication Sig Dispense Refill  . amLODipine (NORVASC) 10 MG tablet Take 10 mg by mouth daily.    . insulin detemir (LEVEMIR) 100 UNIT/ML injection Inject 75 Units into the skin 2 (two) times daily.    . insulin lispro (HUMALOG) 100 UNIT/ML injection Inject 30 Units into the skin 3 (three) times daily before meals.    . Liraglutide (VICTOZA) 18 MG/3ML SOPN Inject 1.8 mg into the skin daily.    Marland Kitchen losartan-hydrochlorothiazide (HYZAAR) 100-12.5 MG tablet Take 1 tablet by mouth daily.  12  . metFORMIN (GLUCOPHAGE) 1000 MG tablet Take 1,000 mg by mouth 2 (two) times daily.    . metoprolol tartrate (LOPRESSOR) 25 MG tablet Take 25 mg by mouth 2 (two) times daily.    Marland Kitchen morphine (MS CONTIN) 30 MG 12 hr tablet Take 1 tablet (30 mg total) by mouth every 8 (eight) hours. 90 tablet 0  . morphine (MSIR) 15 MG tablet Take 1 tablet (15 mg total) by mouth every 4 (four) hours as needed for severe pain. 60 tablet 0  . tamsulosin (FLOMAX) 0.4 MG CAPS capsule Take 0.4 mg by mouth daily.     No current facility-administered medications for this visit.    Review of Systems  Constitutional: Positive for malaise/fatigue. Negative for fever, chills and weight loss.  HENT: Negative.  Negative for congestion, hearing loss, nosebleeds, sore throat and tinnitus.   Eyes: Negative.  Negative for blurred vision, double vision, pain and discharge.  Respiratory: Positive for shortness of breath. Negative for cough, hemoptysis, sputum production and wheezing.   Cardiovascular: Negative.  Negative for chest pain, palpitations, claudication, leg swelling and PND.  Gastrointestinal: Negative for heartburn, nausea, vomiting, abdominal pain, diarrhea, constipation, blood in stool and melena.  Genitourinary: Negative.  Negative for dysuria, urgency, frequency and hematuria.  Musculoskeletal: Positive for back  pain and joint pain. Negative for myalgias and falls.       Back and hip pain. Unable to walk due to pain.  Skin: Negative.  Negative for itching and rash.  Neurological: Negative.  Negative for dizziness, tingling, tremors, sensory change, speech change, focal weakness, seizures, loss of consciousness, weakness and headaches.  Endo/Heme/Allergies: Negative.  Does not bruise/bleed easily.  Psychiatric/Behavioral: Negative.  Negative for depression, suicidal ideas, memory loss and substance abuse. The patient is not nervous/anxious and does not have insomnia.   All other systems reviewed and are negative. 14 point ROS was done and is otherwise as detailed above  or in HPI   PHYSICAL EXAMINATION: ECOG PERFORMANCE STATUS: 2 - Symptomatic, <50% confined to bed  Filed Vitals:   02/11/16 0923  BP: 147/56  Pulse: 71  Temp: 97.8 F (36.6 C)  Resp: 16   Filed Weights   02/11/16 0923  Weight: 272 lb 12.8 oz (123.741 kg)    Physical Exam  Constitutional:  In Wheelchair, unfriendly, angry    LABORATORY DATA:  I have reviewed the data as listed Lab Results  Component Value Date   WBC 14.0* 01/30/2016   HGB 13.1 01/30/2016   HCT 41.5 01/30/2016   MCV 87.2 01/30/2016   PLT 377 01/30/2016   CMP     Component Value Date/Time   Berg 135 01/15/2016 1809   K 3.8 01/15/2016 1809   CL 103 01/15/2016 1809   CO2 25 01/15/2016 1809   GLUCOSE 75 01/15/2016 1809   BUN 28* 01/15/2016 1809   CREATININE 1.15 01/15/2016 1809   CALCIUM 8.3* 01/15/2016 1809   PROT 7.0 01/15/2016 1809   ALBUMIN 3.3* 01/15/2016 1809   AST 16 01/15/2016 1809   ALT 15* 01/15/2016 1809   ALKPHOS 63 01/15/2016 1809   BILITOT 0.2* 01/15/2016 1809   GFRNONAA >60 01/15/2016 1809   GFRAA >60 01/15/2016 1809    RADIOGRAPHIC STUDIES: I have personally reviewed the radiological images as listed and agreed with the findings in the report. No results found.  Study Result     CLINICAL DATA: Initial treatment  strategy for unknown primary malignancy, metastatic to lung.  EXAM: NUCLEAR MEDICINE PET SKULL BASE TO THIGH  TECHNIQUE: 12.5 mCi F-18 FDG was injected intravenously. Full-ring PET imaging was performed from the skull base to thigh after the radiotracer. CT data was obtained and used for attenuation correction and anatomic localization.  FASTING BLOOD GLUCOSE: Value: 146 mg/dl  COMPARISON: CT abdomen pelvis 01/01/2016 and CT chest 12/30/2015.  FINDINGS: NECK  No hypermetabolic lymph nodes in the neck. CT images show no acute findings.  CHEST  Low left internal jugular lymph node measures 10 mm (CT image 46) with an SUV max of 21.6. There are hypermetabolic left internal mammary, mediastinal and subcarinal lymph nodes. Index AP window lymph node measures 9 mm (CT image 68) with an SUV max of 9.1. Subcarinal lymph node measures approximately 9 mm in short axis with an SUV max of 16.0. There is hypermetabolism associated with nodular pleural thickening in the mid and lower left hemi thorax. Patchy hypermetabolism within left lower lobe collapse/ consolidation with a focal area of associated hypermetabolism with an SUV max of 26.7, likely corresponding to CT image 95. CT images show a moderate to large loculated left pleural effusion. Three-vessel coronary artery calcification.  ABDOMEN/PELVIS  Multiple hypermetabolic lesions in the liver, better seen on 01/01/2016. Index lesion in the central liver has an SUV max of 8.1. Slight thickening of the body of the right adrenal gland measuring with an SUV max of 5.2. Gastrohepatic ligament lymph node measures 11 mm (CT image 116) with an SUV max of 7.8. No abnormal hypermetabolism in the left adrenal gland, spleen or pancreas. No additional hypermetabolic lymph nodes. CT images show the liver, adrenal glands, spleen, pancreas, stomach and bowel to be otherwise grossly unremarkable. Cholecystectomy. No free  fluid. Atherosclerotic calcification of the arterial vasculature.  SKELETON  There are scattered hypermetabolic lesions in the skeleton. Index lytic lesion in the right sacrum measures 13 mm (CT image 172) with an SUV max of 19.5.  IMPRESSION: Metastatic disease involving  intrathoracic and upper abdominal lymph nodes, left hemi thorax, liver, right adrenal gland and bones. Given focal hypermetabolism within left lower lobe consolidation, primary malignancy may be bronchogenic in origin.   Electronically Signed  By: Lorin Picket M.D.  On: 01/08/2016 12:34    ASSESSMENT & PLAN:  Stage IV adenocarcinoma of Lung PD L1 NEGATIVE Left Pleural Effusion Pleurx Catheter placement History of Tobacco Use Difficulty controlling anger Back pain  I attempted to discuss final biopsy results and treatment options with the patient and family. EGFR/ALK are pending however patient was prior smoker I anticipate they will most likely be negative. However, at this point given PDL1 negative status and delay in starting therapy and need to initiate therapy I would proceed with Carboplatin/alimta/Avastin. XGEVA for bony metastatic disease.   I am not sure if his pain is all cancer related pain as he has a history of prior back surgery and today remarks that his pain is his old back pain. The majority of the conversation unfortunately revolved around his anger over various pain medication regimens. He was hostile during the visit and unfortunately in spite of his family's attempts to calm him down he became more angry throughout the visit.  I kindly reminded him that as a physician I have a duty to be respectful of my patient's but likewise patient's should also be respectful of those involved in their care. I do not feel Mr Berg will be able to interact in a kindly manner with staff or myself.   Unfortunately I do not believe that we will be able to provide care for this patient at this time but  the patient and family were provided with a 30 day supply of pain medication. In addition referred to [facility]. We have placed an urgent consultation with them.  Harless Nakayama our [place] Director was notified and participated in discharging the patient from the clinic today.  They were presented with a formal discharge letter. They were advised within that letter that we will provide basic services for 30 days until he is established with a new medical oncologist. We will assist to get him established ASAP.   PLEASE NOTE THE FOLLOWING: I admitted the patient at his first appointment with me for pain control and biopsy: Patient denies requesting to be discharged from the inpatient service prior to Monday 01/20/2016 scheduled inpatient biopsy, however: PER Dr. Ledell Noss discharge summary on 01/18/2016  Recommendations for Outpatient Follow-up:  -Will be discharged home today at his request. -Has appointment on 6/5 with interventional radiology a [facility] for percutaneous biopsy, following the results of this should have follow-up with Walter Berg at the [place].  All questions were answered. The patient knows to call the clinic with any problems, questions or concerns.  This document serves as a record of services personally performed by Ancil Linsey, MD. It was created on her behalf by Toni Amend, a trained medical scribe. The creation of this record is based on the scribe's personal observations and the provider's statements to them. This document has been checked and approved by the attending provider.  I have reviewed the above documentation for accuracy and completeness, and I agree with the above.  This note was electronically signed.    Molli Hazard, MD  02/11/2016 4:56 PM

## 2016-02-11 NOTE — Progress Notes (Signed)
Patient was referred to [facility] in [place] on 02/11/16. Latina Craver confirmed that she received our records @ 11:19 am. Manuela Schwartz told Amy scheduler that the physician would review the records and then he would be scheduled and [place] would schedule the patient.

## 2016-02-12 ENCOUNTER — Encounter (HOSPITAL_COMMUNITY): Payer: Self-pay | Admitting: Lab

## 2016-02-12 NOTE — Progress Notes (Signed)
Referral sent to [facility] on 6/28.  Records faxed 6/28 [phone]).  I talked with Renna [phone]) on 6/28 and she will contact patient with appt.

## 2016-02-13 ENCOUNTER — Encounter (HOSPITAL_COMMUNITY): Payer: Self-pay

## 2016-02-13 NOTE — Progress Notes (Signed)
Referred patient to [facility]. [facility] has declined to accept patient for care. Made an urgent referral to [facility] who agreed to see the patient. Amy our scheduler has just spoken with [facility] coordinator at [phone] who advised her that patient was extremely unfriendly and refused to be seen at [place] MD

## 2016-03-01 ENCOUNTER — Encounter (HOSPITAL_COMMUNITY): Payer: Self-pay | Admitting: Hematology & Oncology

## 2016-05-25 IMAGING — MR MR LUMBAR SPINE WO/W CM
5 of 8 series · 30 of 48 positions shown · IV contrast (multihance)
Comparison: Radiography 10/02/2015.  MRI 06/13/2008.

CLINICAL DATA: Chronic back pain with left hip and leg pain,
weakness and numbness, several years duration, but worsening over
the last 5 months.

Creatinine was obtained on site at [HOSPITAL] at [HOSPITAL].
Results: Creatinine 1.5 mg/dL.
EXAM:
MRI LUMBAR SPINE WITHOUT AND WITH CONTRAST
TECHNIQUE: Multiplanar and multiecho pulse sequences of the lumbar spine were
obtained without and with intravenous contrast.
CONTRAST:  20mL MULTIHANCE GADOBENATE DIMEGLUMINE 529 MG/ML IV SOLN

[Series 6: T1 · sagittal · 4.0mm · 0.73mm/px · 4 of 15 slices shown (1 of 2)]
[im 1/15]
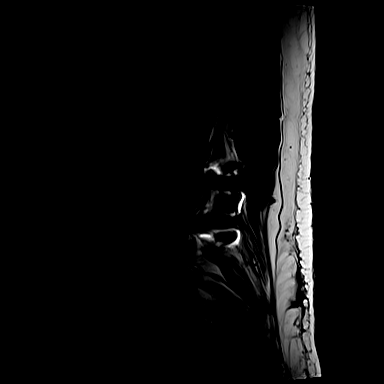
[im 5/15]
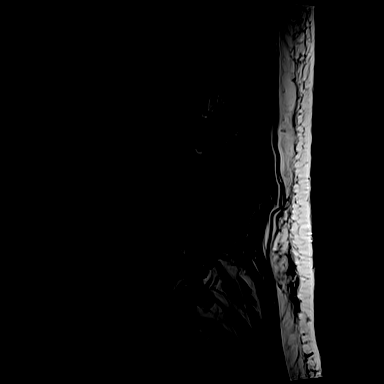
[im 10/15]
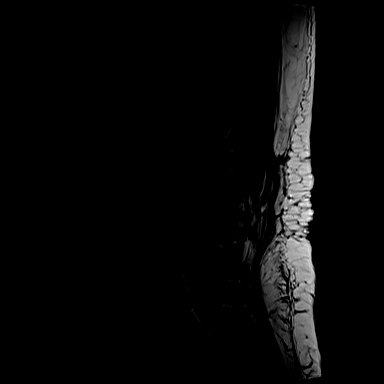
[im 15/15]
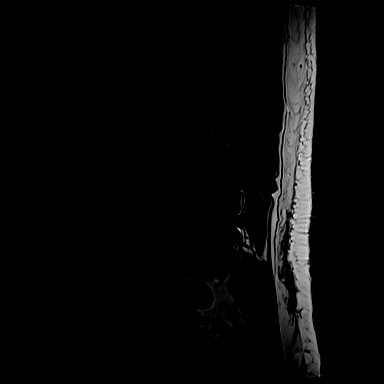

[Series 7: T1 · axial · 4.0mm · 0.70mm/px · z∈[-68,+143]mm · 9 of 34 slices shown (2 of 2)]
[im 1/34]
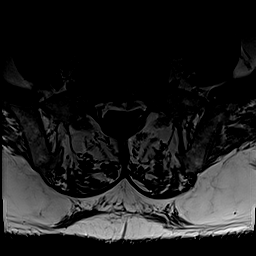
[im 5/34]
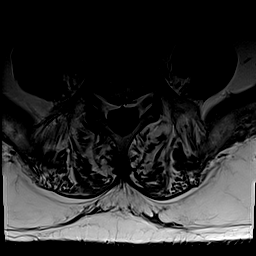
[im 9/34]
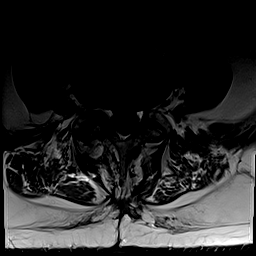
[im 13/34]
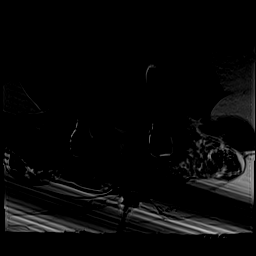
[im 17/34]
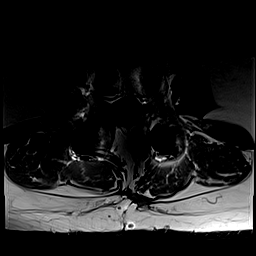
[im 21/34]
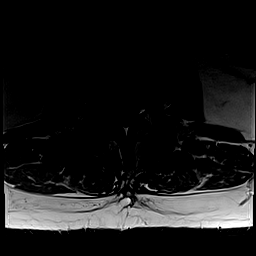
[im 25/34]
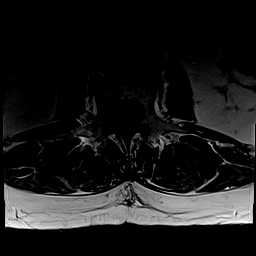
[im 29/34]
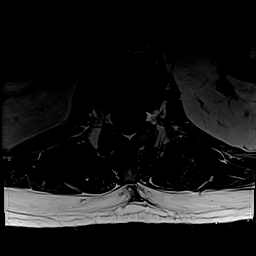
[im 34/34]
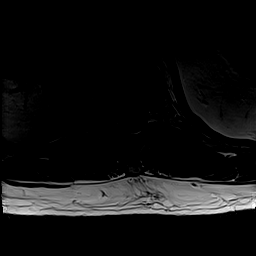

[Series 8: T2 · axial · 4.0mm · 0.35mm/px · z∈[-68,+143]mm · 9 of 34 slices shown]
[im 1/34]
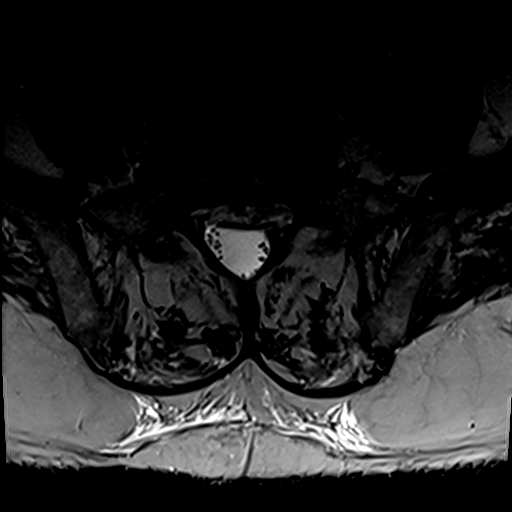
[im 5/34]
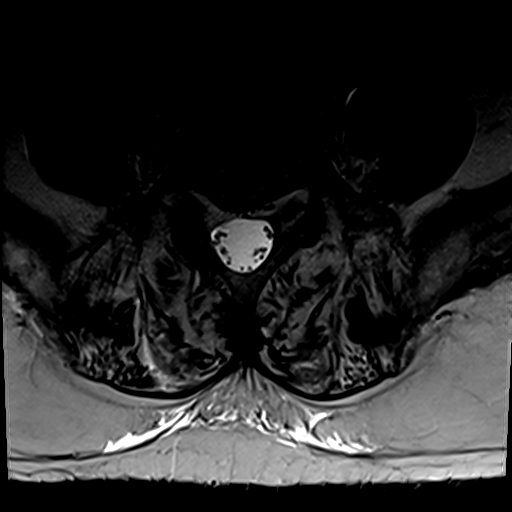
[im 9/34]
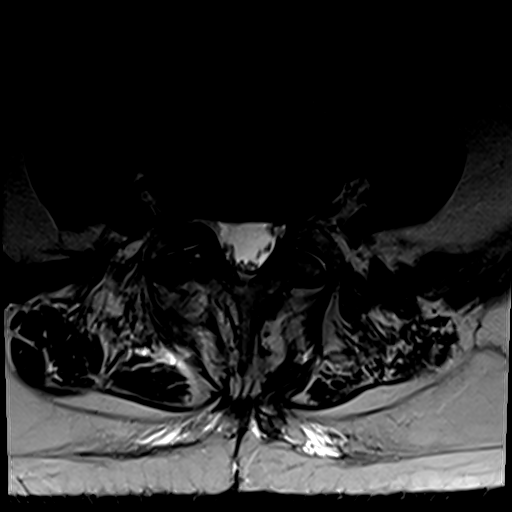
[im 13/34]
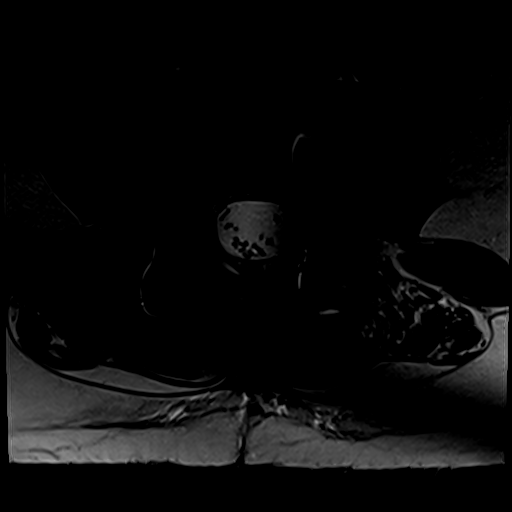
[im 17/34]
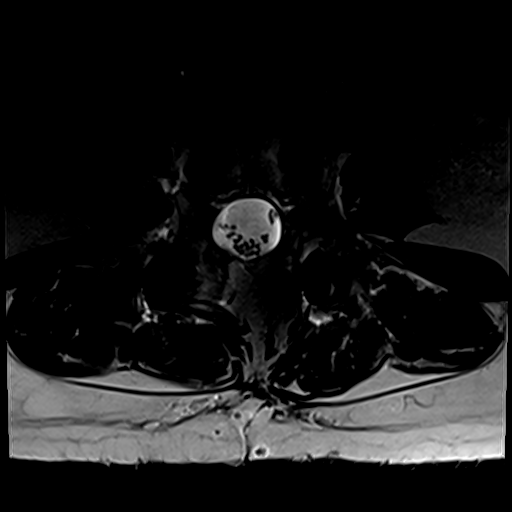
[im 21/34]
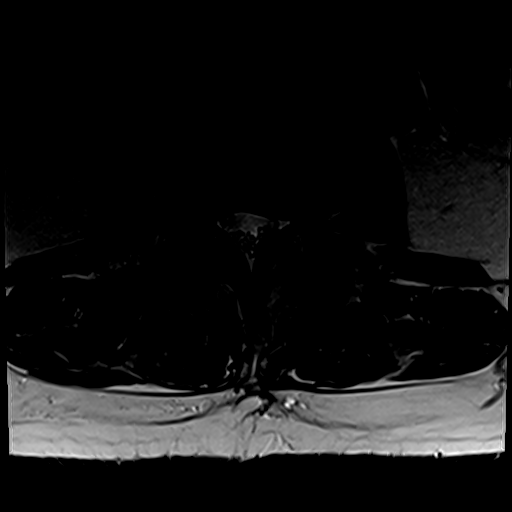
[im 25/34]
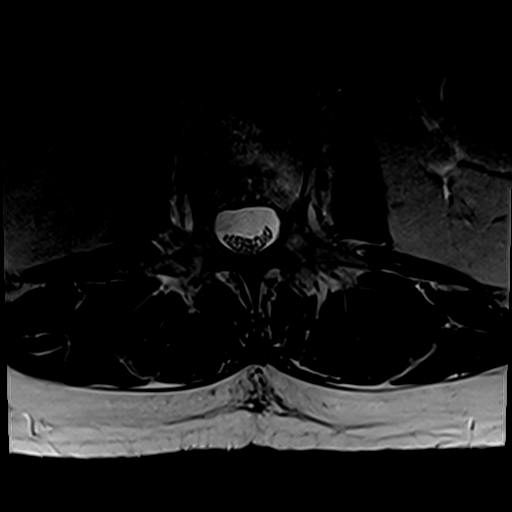
[im 29/34]
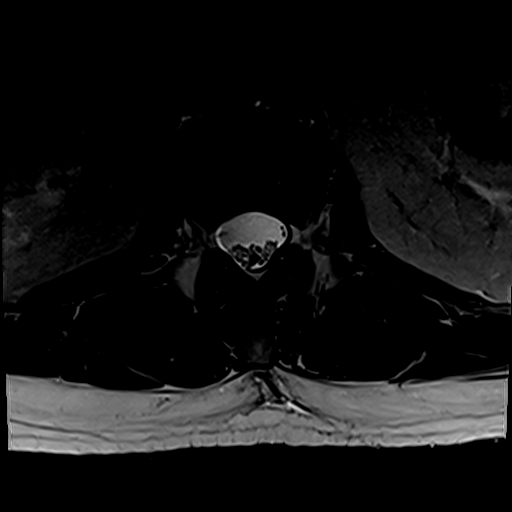
[im 34/34]
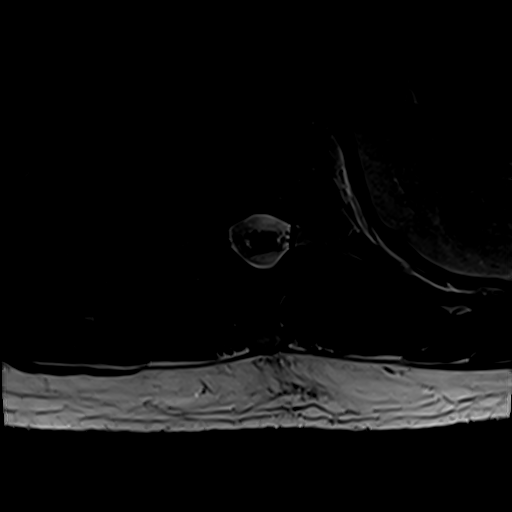

[Series 9: T2 post-contrast · sagittal · 4.0mm · 0.73mm/px · 4 of 15 slices shown]
[im 1/15]
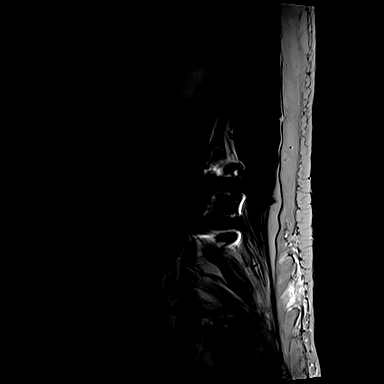
[im 5/15]
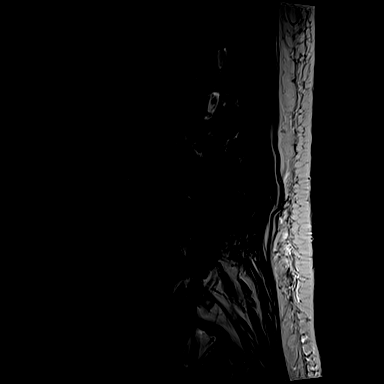
[im 10/15]
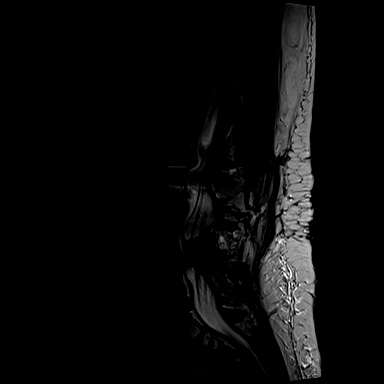
[im 15/15]
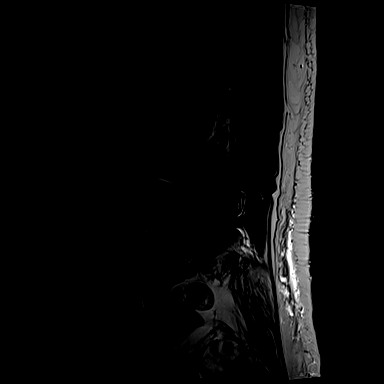

[Series 10: T1 fat-sat post-contrast · sagittal · 4.0mm · 0.73mm/px · 4 of 15 slices shown]
[im 1/15]
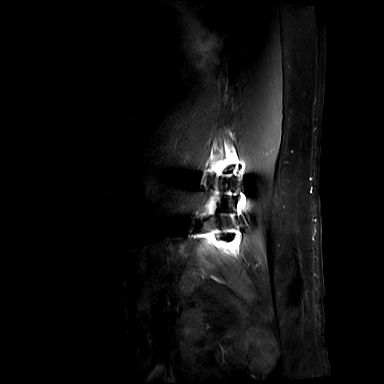
[im 5/15]
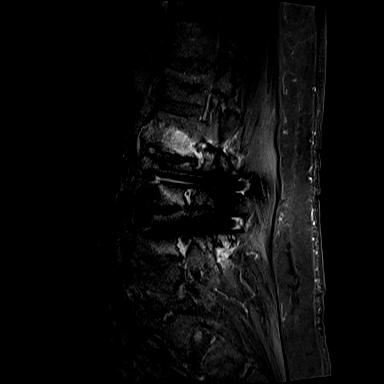
[im 10/15]
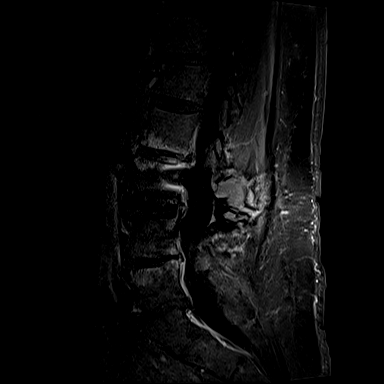
[im 15/15]
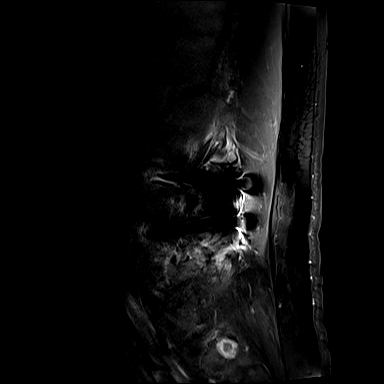

[30 of 48 positions shown; findings below may reference images not displayed]

FINDINGS: There is an old superior endplate fracture at L1 with minimal
posterior bowing of the posterior superior margin of the L1
vertebral body. This indents the thecal sac slightly but does not
cause any neural compression. The distal cord and conus are normal
with the conus tip at L1-2.

L1-2:  Normal interspace.

L2-3: Disc degeneration with a broad-based disc herniation with
caudal migration of disc material to the left of midline. This
indents the thecal sac. There is mild narrowing of both lateral
recesses. This could possibly affect the left L2 or L3 nerve roots.

L3-4: Previous posterior decompression and fusion. Wide patency of
the canal and foramina.

L4-5: Disc degeneration with endplate osteophytes and bulging of the
disc. Mild facet hypertrophy. No central canal stenosis. Question if
there is a small amount of disc material extending towards the
foramen on the right. This could affect the right L4 nerve root, but
the patient does not described right-sided symptoms.

L5-S1: Disc degeneration with endplate osteophytes and shallow
broad-based herniation of disc material more prominent towards the
left. Mild facet and ligamentous hypertrophy. Narrowing of the
subarticular lateral recess that could possibly affect the S1 nerve
root.
IMPRESSION: Good appearance at the fusion segment of L3-4.

L2-3: Broad-based disc herniation with caudal migration on the left.
This would have some potential to affect the left L2 and L3 nerve
roots.

L4-5: Degenerative spondylosis. Question extension of disc material
towards the foramen on the right that could affect the L4 nerve
root. Artifact from the fusion hardware disrupt see images in that
region. In any case, the patient does not described right leg pain
in the given history.

L5-S1: Broad-based, left predominant disc herniation. Narrowing of
the subarticular lateral recess that could possibly affect the left
S1 nerve root.

## 2016-06-17 DEATH — deceased
